# Patient Record
Sex: Male | Born: 1970 | Race: Black or African American | Hispanic: No | Marital: Single | Smoking: Current some day smoker
Health system: Southern US, Community
[De-identification: ages and names within clinical notes are randomized; demographics above are authoritative.]

## PROBLEM LIST (undated history)

## (undated) DIAGNOSIS — B019 Varicella without complication: Secondary | ICD-10-CM

## (undated) HISTORY — DX: Varicella without complication: B01.9

## (undated) HISTORY — PX: INGUINAL HERNIA REPAIR: SUR1180

## (undated) HISTORY — PX: HERNIA REPAIR: SHX51

---

## 2002-09-06 ENCOUNTER — Encounter: Payer: Self-pay | Admitting: Emergency Medicine

## 2002-09-06 ENCOUNTER — Emergency Department (HOSPITAL_COMMUNITY): Admission: EM | Admit: 2002-09-06 | Discharge: 2002-09-06 | Payer: Self-pay | Admitting: Emergency Medicine

## 2006-05-12 ENCOUNTER — Emergency Department (HOSPITAL_COMMUNITY): Admission: EM | Admit: 2006-05-12 | Discharge: 2006-05-12 | Payer: Self-pay | Admitting: Emergency Medicine

## 2008-07-21 IMAGING — CR DG ANKLE COMPLETE 3+V*R*
3 series · 3 of 3 positions shown · non-contrast
Comparison: None.

CLINICAL DATA: Fall with lateral pain and swelling. 
 RIGHT ANKLE ? 3 VIEW:

[t ankle joint ap right]
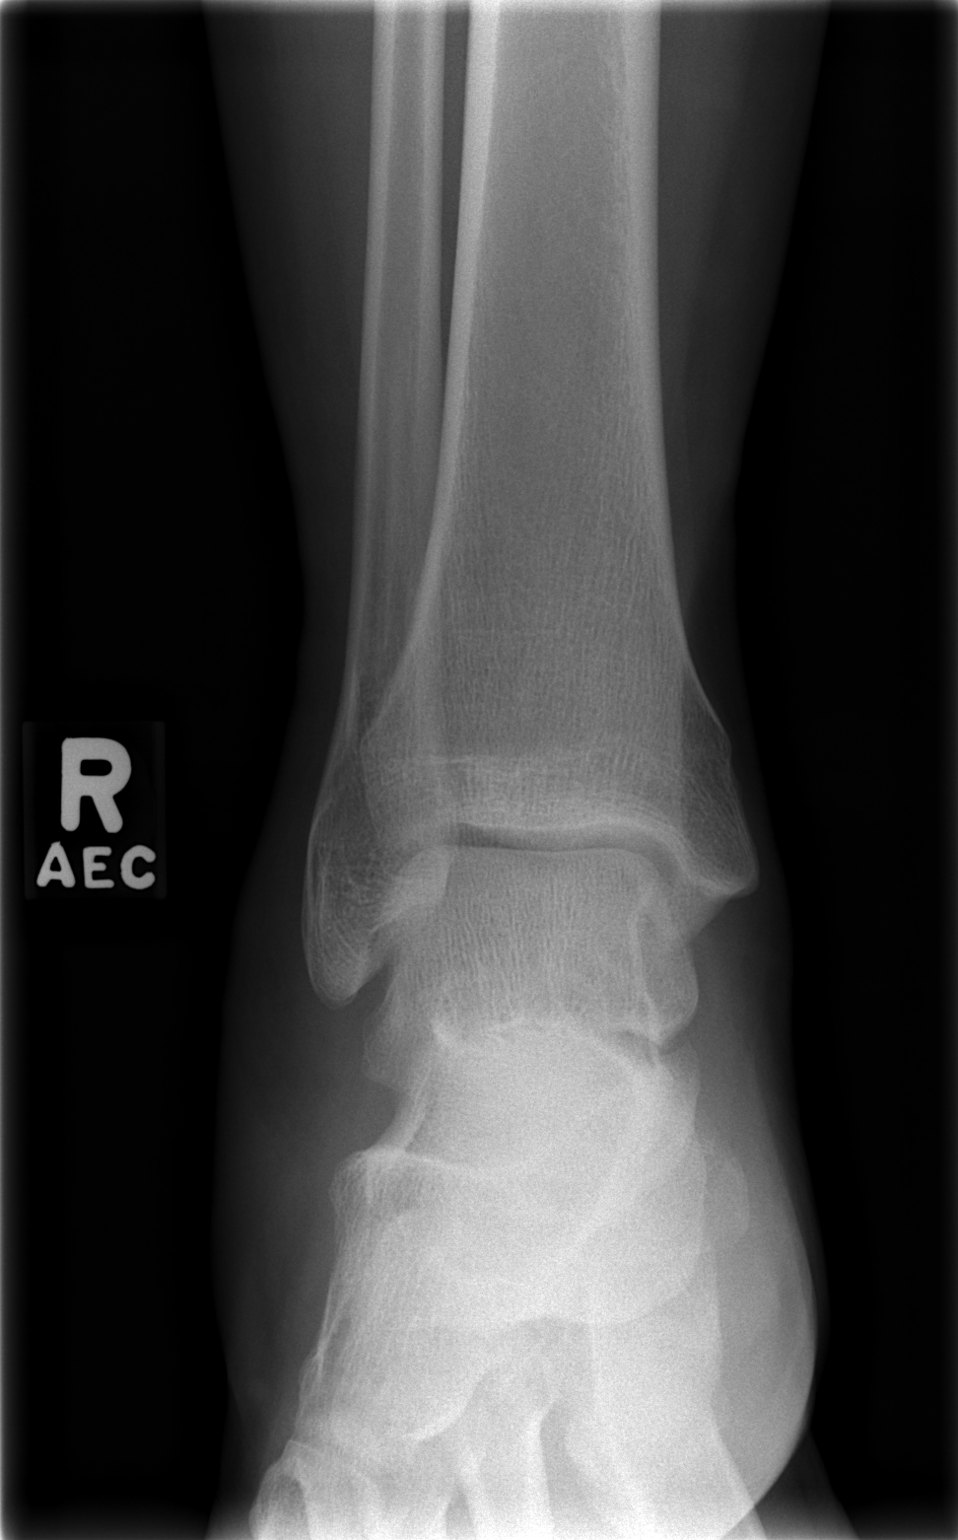

[t ankle joint oblique right]
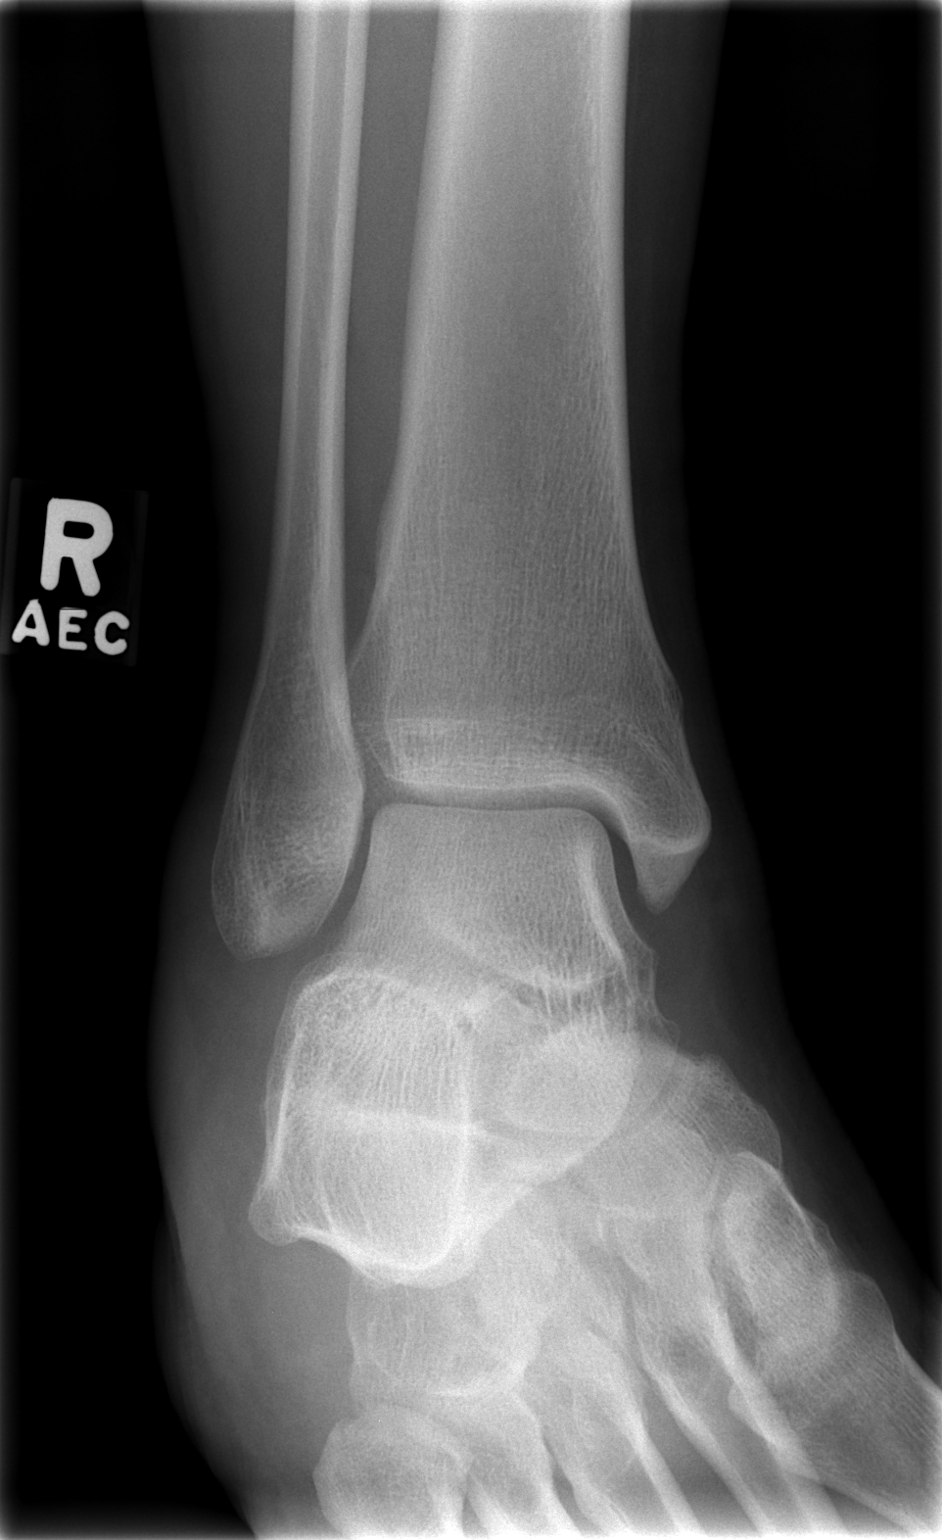

[t ankle joint lat right]
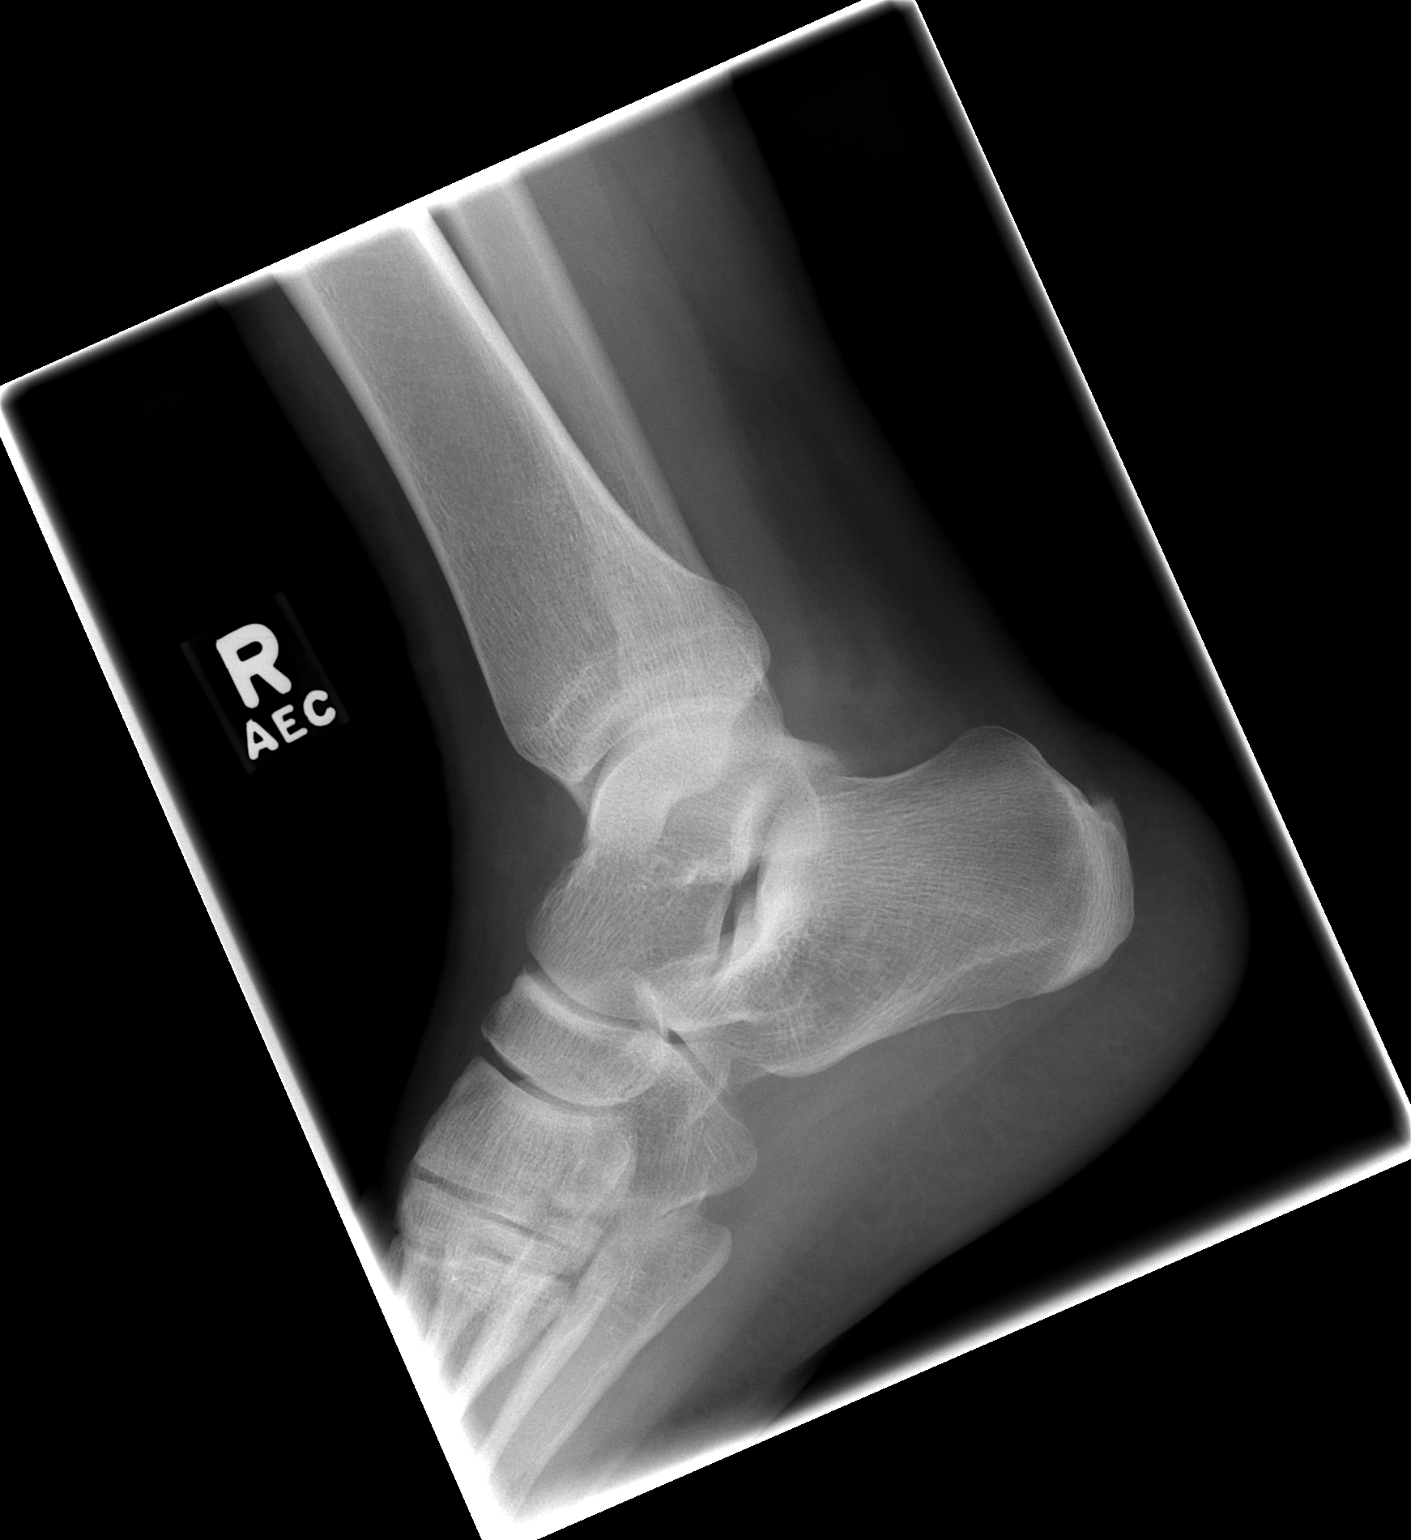

[3 of 3 positions shown; findings below may reference images not displayed]

FINDINGS: Moderate lateral malleolar soft tissue swelling.  Physeal spur about the medial tibia.  Talar dome intact.  Base of 5th metatarsal normal.  Small Achilles spur.  No evidence of fracture.
IMPRESSION: Lateral malleolar soft tissue swelling without acute finding.

## 2010-03-07 ENCOUNTER — Emergency Department (HOSPITAL_BASED_OUTPATIENT_CLINIC_OR_DEPARTMENT_OTHER)
Admission: EM | Admit: 2010-03-07 | Discharge: 2010-03-07 | Payer: Self-pay | Source: Home / Self Care | Admitting: Emergency Medicine

## 2017-02-13 ENCOUNTER — Ambulatory Visit: Payer: BLUE CROSS/BLUE SHIELD | Admitting: Nurse Practitioner

## 2017-02-13 ENCOUNTER — Encounter: Payer: Self-pay | Admitting: Nurse Practitioner

## 2017-02-13 VITALS — BP 116/86 | HR 85 | Temp 98.0°F | Ht 69.5 in | Wt 227.0 lb

## 2017-02-13 DIAGNOSIS — B070 Plantar wart: Secondary | ICD-10-CM | POA: Diagnosis not present

## 2017-02-13 DIAGNOSIS — M79605 Pain in left leg: Secondary | ICD-10-CM

## 2017-02-13 NOTE — Patient Instructions (Addendum)
You will be contacted to schedule appt for venous doppler.  Use compression stocking during the day and off at night.  Use salicylic acid OTC on wart. As directed on package.   Warts Warts are small growths on the skin. They are common, and they are caused by a type of germ (virus). Warts can occur on many areas of the body. A person may have one wart or more than one wart. Warts can spread if you scratch a wart and then scratch normal skin. Most warts will go away over many months to a couple years. Treatments may be done if needed. Follow these instructions at home:  Apply over-the-counter and prescription medicines only as told by your doctor.  Do not apply over-the-counter wart medicines to your face or genitals before you ask your doctor if it is okay to do that.  Do not scratch or pick at a wart.  Wash your hands after you touch a wart.  Avoid shaving hair that is over a wart.  Keep all follow-up visits as told by your doctor. This is important. Contact a doctor if:  Your warts do not improve after treatment.  You have redness, swelling, or pain at the site of a wart.  You have bleeding from a wart, and the bleeding does not stop when you put light pressure on the wart.  You have diabetes and you get a wart. This information is not intended to replace advice given to you by your health care provider. Make sure you discuss any questions you have with your health care provider. Document Released: 06/29/2010 Document Revised: 08/04/2015 Document Reviewed: 05/24/2014 Elsevier Interactive Patient Education  [employer].

## 2017-02-13 NOTE — Progress Notes (Signed)
Subjective:  Patient ID: Brian Ray, male    DOB: [birth year]  Age: 46 y.o. MRN: [id number]  CC: Leg Pain (left leg pain--2 wk ago--felt like pop ,felt like a knot near caft area) and Pain (spot on left foot,painful--3 mo)  Brian Ray is here to establish care. Has no previous pcp. He will like to discuss left calf pain and foot nodule  Leg Pain   The incident occurred more than 1 week ago. There was no injury mechanism. The pain is present in the left leg (left calf muscle). The pain has been fluctuating since onset. Pertinent negatives include no inability to bear weight, muscle weakness, numbness or tingling. The symptoms are aggravated by weight bearing and palpation. He has tried rest for the symptoms. The treatment provided significant relief.  Rash  This is a new (nodule on bottom of left foot.) problem. The current episode started more than 1 month ago. The problem is unchanged. The affected locations include the left foot (no joint pain, no foot swelling, no injury). The rash is characterized by pain. He was exposed to nothing. Pertinent negatives include no fatigue, fever or joint pain. Treatments tried: use on shoe inserts. The treatment provided significant relief.   Hx of plantar's wart on palm of hand..   No outpatient medications prior to visit.   No facility-administered medications prior to visit.     ROS Review of Systems  Constitutional: Negative for fatigue and fever.  Respiratory: Negative.   Cardiovascular: Negative.   Musculoskeletal: Negative for back pain, falls and joint pain.  Skin: Positive for rash.  Neurological: Negative for tingling and numbness.  Endo/Heme/Allergies: Negative.     Objective:  BP 116/86   Pulse 85   Temp 98 F (36.7 C)   Ht 5' 9.5" (1.765 m)   Wt 227 lb (103 kg)   SpO2 98%   BMI 33.04 kg/m   BP Readings from Last 3 Encounters:  02/13/17 116/86    Wt Readings from Last 3 Encounters:  02/13/17 227 lb (103 kg)     Physical Exam  Constitutional: He is oriented to person, place, and time. No distress.  Cardiovascular: Normal rate.  Pulmonary/Chest: Effort normal.  Musculoskeletal: He exhibits edema and tenderness. He exhibits no deformity.       Left knee: Normal.       Left ankle: Normal.       Left lower leg: He exhibits tenderness and swelling.       Feet:  Left calf tenderness. No erythema  Neurological: He is alert and oriented to person, place, and time.  Skin: Skin is warm and dry. Rash noted. No erythema.  Psychiatric: He has a normal mood and affect.  Vitals reviewed.   No results found for: WBC, HGB, HCT, PLT, GLUCOSE, CHOL, TRIG, HDL, LDLDIRECT, LDLCALC, ALT, AST, NA, K, CL, CREATININE, BUN, CO2, TSH, PSA, INR, GLUF, HGBA1C, MICROALBUR  Assessment & Plan:   Brian Ray was seen today for leg pain and pain.  Diagnoses and all orders for this visit:  Leg pain, posterior, left -     VAS [place] LOWER EXTREMITY VENOUS (DVT); Future  Plantar wart -     Salicylic Acid 2 % PADS; Apply 1 application topically daily at 12 noon.   I am having Brian Ray start on Salicylic Acid.  Meds ordered this encounter  Medications  . Salicylic Acid 2 % PADS    Sig: Apply 1 application topically daily at 12 noon.  Refill:  0    Order Specific Question:   Supervising Provider    Answer:   Lucille Passy [[id number]]    Follow-up: Return in about 4 weeks (around 03/13/2017) for CPE (fasting).  Brian Lacy, NP

## 2017-02-14 ENCOUNTER — Inpatient Hospital Stay (HOSPITAL_COMMUNITY): Admission: RE | Admit: 2017-02-14 | Payer: BLUE CROSS/BLUE SHIELD | Source: Ambulatory Visit

## 2017-02-14 ENCOUNTER — Encounter: Payer: Self-pay | Admitting: Nurse Practitioner

## 2017-02-14 MED ORDER — SALICYLIC ACID 2 % EX PADS: 1.0000 "application " | MEDICATED_PAD | Freq: Every day | CUTANEOUS | 0 refills | Status: DC

## 2017-02-19 ENCOUNTER — Ambulatory Visit: Payer: BLUE CROSS/BLUE SHIELD | Admitting: Nurse Practitioner

## 2017-02-22 ENCOUNTER — Ambulatory Visit (HOSPITAL_COMMUNITY)
Admission: RE | Admit: 2017-02-22 | Discharge: 2017-02-22 | Disposition: A | Discharge status: Home / Self Care | Payer: BLUE CROSS/BLUE SHIELD | Source: Ambulatory Visit | Attending: Nurse Practitioner | Admitting: Nurse Practitioner

## 2017-02-22 ENCOUNTER — Encounter: Payer: Self-pay | Admitting: Nurse Practitioner

## 2017-02-22 ENCOUNTER — Ambulatory Visit: Payer: BLUE CROSS/BLUE SHIELD | Admitting: Nurse Practitioner

## 2017-02-22 VITALS — BP 120/80 | HR 98 | Temp 98.0°F | Ht 69.5 in | Wt 232.0 lb

## 2017-02-22 DIAGNOSIS — Z8042 Family history of malignant neoplasm of prostate: Secondary | ICD-10-CM

## 2017-02-22 DIAGNOSIS — Z1211 Encounter for screening for malignant neoplasm of colon: Secondary | ICD-10-CM

## 2017-02-22 DIAGNOSIS — Z13220 Encounter for screening for lipoid disorders: Secondary | ICD-10-CM

## 2017-02-22 DIAGNOSIS — Z125 Encounter for screening for malignant neoplasm of prostate: Secondary | ICD-10-CM | POA: Diagnosis not present

## 2017-02-22 DIAGNOSIS — Z136 Encounter for screening for cardiovascular disorders: Secondary | ICD-10-CM | POA: Diagnosis not present

## 2017-02-22 DIAGNOSIS — Z114 Encounter for screening for human immunodeficiency virus [HIV]: Secondary | ICD-10-CM

## 2017-02-22 DIAGNOSIS — Z800 Family history of malignant neoplasm of digestive organs: Secondary | ICD-10-CM | POA: Insufficient documentation

## 2017-02-22 DIAGNOSIS — M79605 Pain in left leg: Secondary | ICD-10-CM | POA: Insufficient documentation

## 2017-02-22 DIAGNOSIS — Z0000 Encounter for general adult medical examination without abnormal findings: Secondary | ICD-10-CM | POA: Diagnosis not present

## 2017-02-22 NOTE — Progress Notes (Signed)
*  PRELIMINARY RESULTS* Vascular Ultrasound Left lower extremity venous duplex has been completed.  Preliminary findings: No evidence of deep vein thrombosis or baker's cysts in the left lower extremity.  Small hypoechoic fluid collection noted in the medial aspect of the left calf.  Preliminary results called to [facility] @ 11:31.   Myrtie Cruise Rosalene Wardrop 02/22/2017, 11:30 AM

## 2017-02-22 NOTE — Progress Notes (Signed)
Subjective:    Patient ID: Brian Ray, male    DOB: [birth year], 46 y.o.   MRN: [id number]  Patient presents today for complete physical and re eval of right calf swelling.  HPI  Improved right calf swelling and pain with use of compression stocking. Venous doppler was negative for DVT.  No previous pcp.  Immunizations: (TDAP, Hep C screen, Pneumovax, Influenza, zoster)  Health Maintenance  Topic Date Due  . Flu Shot  10/16/2017*  . Tetanus Vaccine  01/11/2023  . HIV Screening  Completed  *Topic was postponed. The date shown is not the original due date.   Diet:regular.  Weight:  Wt Readings from Last 3 Encounters:  02/22/17 232 lb (105.2 kg)  02/13/17 227 lb (103 kg)   Exercise: none.  Fall Risk: Fall Risk  02/22/2017  Falls in the past year? No   Home Safety:home with girlfriend.  Depression/Suicide: Depression screen [facility] 2/9 02/22/2017  Decreased Interest 0  Down, Depressed, Hopeless 0  PHQ - 2 Score 0   Vision:will schedule.  Dental:will schedule.  Sexual History (birth control, marital status, STD):single, sexually active  Medications and allergies reviewed with patient and updated if appropriate.  Patient Active Problem List   Diagnosis Date Noted  . FHx: cancer of prostate 02/22/2017    Current Outpatient Medications on File Prior to Visit  Medication Sig Dispense Refill  . Salicylic Acid 2 % PADS Apply 1 application topically daily at 12 noon.  0   No current facility-administered medications on file prior to visit.     Past Medical History:  Diagnosis Date  . Chicken pox     Past Surgical History:  Procedure Laterality Date  . HERNIA REPAIR Right    inguinal    Social History   Socioeconomic History  . Marital status: Single    Spouse name: None  . Number of children: None  . Years of education: None  . Highest education level: None  Social Needs  . Financial resource strain: None  . Food insecurity - worry: None  . Food  insecurity - inability: None  . Transportation needs - medical: None  . Transportation needs - non-medical: None  Occupational History  . None  Tobacco Use  . Smoking status: Never Smoker  . Smokeless tobacco: Never Used  Substance and Sexual Activity  . Alcohol use: Yes    Comment: social  . Drug use: No  . Sexual activity: Yes    Birth control/protection: None  Other Topics Concern  . None  Social History Narrative  . None    Family History  Problem Relation Age of Onset  . ALS Mother 65  . Cancer Brother 44       prostate cancer  . Alcohol abuse Father   . Cancer Maternal Uncle        unknown type.       Review of Systems  Constitutional: Negative for fever, malaise/fatigue and weight loss.  HENT: Negative for congestion and sore throat.   Eyes:       Negative for visual changes  Respiratory: Negative for cough and shortness of breath.   Cardiovascular: Negative for chest pain and palpitations.  Gastrointestinal: Negative for blood in stool, constipation, diarrhea and heartburn.  Genitourinary: Negative for dysuria, frequency and urgency.  Musculoskeletal: Negative for falls, joint pain and myalgias.  Skin: Negative for rash.  Neurological: Negative for dizziness, sensory change and headaches.  Endo/Heme/Allergies: Does not bruise/bleed easily.  Psychiatric/Behavioral: Negative for depression,  substance abuse and suicidal ideas. The patient is not nervous/anxious and does not have insomnia.     Objective:   Vitals:   02/22/17 1512  BP: 120/80  Pulse: 98  Temp: 98 F (36.7 C)  SpO2: 97%    Body mass index is 33.77 kg/m.   Physical Examination:  Physical Exam  Constitutional: He is oriented to person, place, and time and well-developed, well-nourished, and in no distress. No distress.  HENT:  Right Ear: External ear normal.  Left Ear: External ear normal.  Nose: Nose normal.  Mouth/Throat: Oropharynx is clear and moist. No oropharyngeal exudate.    Eyes: Conjunctivae and EOM are normal. Pupils are equal, round, and reactive to light. No scleral icterus.  Neck: Normal range of motion. Neck supple. No thyromegaly present.  Cardiovascular: Normal rate, regular rhythm, normal heart sounds and intact distal pulses.  Pulmonary/Chest: Effort normal and breath sounds normal.  Abdominal: Bowel sounds are normal. He exhibits no distension. There is no tenderness.  Genitourinary: Prostate normal. Rectal exam shows no external hemorrhoid, no internal hemorrhoid, no fissure and guaiac negative stool.  Musculoskeletal: Normal range of motion. He exhibits no edema, tenderness or deformity.       Right hip: Normal.       Right knee: Normal.       Right ankle: Normal.       Right upper leg: Normal.       Right lower leg: He exhibits no tenderness, no bony tenderness, no edema, no deformity and no laceration.       Right foot: Normal.  Improved right calf swelling with resolved tenderness. No erythema. No induration  Lymphadenopathy:    He has no cervical adenopathy.  Neurological: He is alert and oriented to person, place, and time. He has normal reflexes. No cranial nerve deficit. Gait normal.  Skin: Skin is warm and dry.  Psychiatric: Affect and judgment normal.  Vitals reviewed.   ASSESSMENT and PLAN:  Brian Ray was seen today for establish care.  Diagnoses and all orders for this visit:  Preventative health care -     Cancel: Comprehensive metabolic panel -     Cancel: Lipid panel -     Cancel: CBC with Differential/Platelet -     Cancel: TSH -     Cancel: PSA -     Cancel: HIV antibody -     Lipid panel; Future -     Comprehensive metabolic panel; Future -     CBC with Differential/Platelet; Future -     TSH; Future -     HIV antibody; Future -     PSA; Future -     PSA -     HIV antibody -     TSH -     CBC with Differential/Platelet -     Comprehensive metabolic panel -     Lipid panel  Encounter for lipid screening for  cardiovascular disease -     Cancel: Lipid panel -     Lipid panel; Future -     Lipid panel  Encounter for prostate cancer screening -     Cancel: PSA -     PSA; Future -     PSA  Encounter for screening for HIV -     Cancel: HIV antibody -     HIV antibody; Future -     HIV antibody  Colon cancer screening -     IFOBT POC (occult bld, rslt in office); Future  FHx: cancer of prostate -     PSA; Future -     PSA   No problem-specific Assessment & Plan notes found for this encounter.    Recent Results (from the past 2160 hour(s))  PSA     Status: None   Collection Time: 02/22/17  4:22 PM  Result Value Ref Range   PSA 0.8 < OR = 4.0 ng/mL    Comment: The total PSA value from this assay system is  standardized against the WHO standard. The test  result will be approximately 20% lower when compared  to the equimolar-standardized total PSA (Beckman  Coulter). Comparison of serial PSA results should be  interpreted with this fact in mind. . This test was performed using the Siemens  chemiluminescent method. Values obtained from  different assay methods cannot be used interchangeably. PSA levels, regardless of value, should not be interpreted as absolute evidence of the presence or absence of disease.   HIV antibody     Status: None   Collection Time: 02/22/17  4:22 PM  Result Value Ref Range   HIV 1&2 Ab, 4th Generation NON-REACTIVE NON-REACTI    Comment: HIV-1 antigen and HIV-1/HIV-2 antibodies were not detected. There is no laboratory evidence of HIV infection. Marland Kitchen PLEASE NOTE: This information has been disclosed to you from records whose confidentiality may be protected by state law.  If your state requires such protection, then the state law prohibits you from making any further disclosure of the information without the specific written consent of the person to whom it pertains, or as otherwise permitted by law. A general authorization for the release of  medical or other information is NOT sufficient for this purpose. . For additional information please refer to http://education.[facility].com/faq/FAQ106 (This link is being provided for informational/ educational purposes only.) . Marland Kitchen The performance of this assay has not been clinically validated in patients less than 45 years old. .   TSH     Status: None   Collection Time: 02/22/17  4:22 PM  Result Value Ref Range   TSH 0.45 0.40 - 4.50 mIU/L  CBC with Differential/Platelet     Status: None   Collection Time: 02/22/17  4:22 PM  Result Value Ref Range   WBC 8.8 3.8 - 10.8 Thousand/uL   RBC 4.99 4.20 - 5.80 Million/uL   Hemoglobin 14.1 13.2 - 17.1 g/dL   HCT 42.1 38.5 - 50.0 %   MCV 84.4 80.0 - 100.0 fL   MCH 28.3 27.0 - 33.0 pg   MCHC 33.5 32.0 - 36.0 g/dL   RDW 12.3 11.0 - 15.0 %   Platelets 397 140 - 400 Thousand/uL   MPV 10.1 7.5 - 12.5 fL   Neutro Abs 6,019 1,500 - 7,800 cells/uL   Lymphs Abs 1,892 850 - 3,900 cells/uL   WBC mixed population 546 200 - 950 cells/uL   Eosinophils Absolute 299 15 - 500 cells/uL   Basophils Absolute 44 0 - 200 cells/uL   Neutrophils Relative % 68.4 %   Total Lymphocyte 21.5 %   Monocytes Relative 6.2 %   Eosinophils Relative 3.4 %   Basophils Relative 0.5 %  Comprehensive metabolic panel     Status: None   Collection Time: 02/22/17  4:22 PM  Result Value Ref Range   Glucose, Bld 99 65 - 99 mg/dL    Comment: .            Fasting reference interval .    BUN 11 7 - 25  mg/dL   Creat 0.86 0.60 - 1.35 mg/dL   BUN/Creatinine Ratio NOT APPLICABLE 6 - 22 (calc)   Sodium 141 135 - 146 mmol/L   Potassium 4.3 3.5 - 5.3 mmol/L   Chloride 106 98 - 110 mmol/L   CO2 27 20 - 32 mmol/L   Calcium 9.4 8.6 - 10.3 mg/dL   Total Protein 7.4 6.1 - 8.1 g/dL   Albumin 3.9 3.6 - 5.1 g/dL   Globulin 3.5 1.9 - 3.7 g/dL (calc)   AG Ratio 1.1 1.0 - 2.5 (calc)   Total Bilirubin 0.4 0.2 - 1.2 mg/dL   Alkaline phosphatase (APISO) 57 40 - 115 U/L    AST 13 10 - 40 U/L   ALT 15 9 - 46 U/L  Lipid panel     Status: Abnormal   Collection Time: 02/22/17  4:22 PM  Result Value Ref Range   Cholesterol 132 <200 mg/dL   HDL 66 >40 mg/dL   Triglycerides 152 (H) <150 mg/dL   LDL Cholesterol (Calc) 43 mg/dL (calc)    Comment: Reference range: <100 . Desirable range <100 mg/dL for primary prevention;   <70 mg/dL for patients with CHD or diabetic patients  with > or = 2 CHD risk factors. Marland Kitchen LDL-C is now calculated using the Martin-Hopkins  calculation, which is a validated novel method providing  better accuracy than the Friedewald equation in the  estimation of LDL-C.  Cresenciano Genre et al. Annamaria Helling. 3810;175(10): 2061-2068  (http://education.[facility].com/faq/FAQ164)    Total CHOL/HDL Ratio 2.0 <5.0 (calc)   Non-HDL Cholesterol (Calc) 66 <130 mg/dL (calc)    Comment: For patients with diabetes plus 1 major ASCVD risk  factor, treating to a non-HDL-C goal of <100 mg/dL  (LDL-C of <70 mg/dL) is considered a therapeutic  option.    Follow up: Return if symptoms worsen or fail to improve.  Wilfred Lacy, NP

## 2017-02-22 NOTE — Patient Instructions (Addendum)
Normal labs results.  Continue use of compression stocking as previously discussed.   Health Maintenance, Male A healthy lifestyle and preventive care is important for your health and wellness. Ask your health care provider about what schedule of regular examinations is right for you. What should I know about weight and diet? Eat a Healthy Diet  Eat plenty of vegetables, fruits, whole grains, low-fat dairy products, and lean protein.  Do not eat a lot of foods high in solid fats, added sugars, or salt.  Maintain a Healthy Weight Regular exercise can help you achieve or maintain a healthy weight. You should:  Do at least 150 minutes of exercise each week. The exercise should increase your heart rate and make you sweat (moderate-intensity exercise).  Do strength-training exercises at least twice a week.  Watch Your Levels of Cholesterol and Blood Lipids  Have your blood tested for lipids and cholesterol every 5 years starting at 44 years of age. If you are at high risk for heart disease, you should start having your blood tested when you are 44 years old. You may need to have your cholesterol levels checked more often if: ? Your lipid or cholesterol levels are high. ? You are older than 44 years of age. ? You are at high risk for heart disease.  What should I know about cancer screening? Many types of cancers can be detected early and may often be prevented. Lung Cancer  You should be screened every year for lung cancer if: ? You are a current smoker who has smoked for at least 30 years. ? You are a former smoker who has quit within the past 15 years.  Talk to your health care provider about your screening options, when you should start screening, and how often you should be screened.  Colorectal Cancer  Routine colorectal cancer screening usually begins at 44 years of age and should be repeated every 5-10 years until you are 44 years old. You may need to be screened more often  if early forms of precancerous polyps or small growths are found. Your health care provider may recommend screening at an earlier age if you have risk factors for colon cancer.  Your health care provider may recommend using home test kits to check for hidden blood in the stool.  A small camera at the end of a tube can be used to examine your colon (sigmoidoscopy or colonoscopy). This checks for the earliest forms of colorectal cancer.  Prostate and Testicular Cancer  Depending on your age and overall health, your health care provider may do certain tests to screen for prostate and testicular cancer.  Talk to your health care provider about any symptoms or concerns you have about testicular or prostate cancer.  Skin Cancer  Check your skin from head to toe regularly.  Tell your health care provider about any new moles or changes in moles, especially if: ? There is a change in a mole's size, shape, or color. ? You have a mole that is larger than a pencil eraser.  Always use sunscreen. Apply sunscreen liberally and repeat throughout the day.  Protect yourself by wearing long sleeves, pants, a wide-brimmed hat, and sunglasses when outside.  What should I know about heart disease, diabetes, and high blood pressure?  If you are 49-75 years of age, have your blood pressure checked every 3-5 years. If you are 37 years of age or older, have your blood pressure checked every year. You should have your blood  pressure measured twice-once when you are at a hospital or clinic, and once when you are not at a hospital or clinic. Record the average of the two measurements. To check your blood pressure when you are not at a hospital or clinic, you can use: ? An automated blood pressure machine at a pharmacy. ? A home blood pressure monitor.  Talk to your health care provider about your target blood pressure.  If you are between 47-89 years old, ask your health care provider if you should take aspirin  to prevent heart disease.  Have regular diabetes screenings by checking your fasting blood sugar level. ? If you are at a normal weight and have a low risk for diabetes, have this test once every three years after the age of 58. ? If you are overweight and have a high risk for diabetes, consider being tested at a younger age or more often.  A one-time screening for abdominal aortic aneurysm (AAA) by ultrasound is recommended for men aged 74-75 years who are current or former smokers. What should I know about preventing infection? Hepatitis B If you have a higher risk for hepatitis B, you should be screened for this virus. Talk with your health care provider to find out if you are at risk for hepatitis B infection. Hepatitis C Blood testing is recommended for:  Everyone born from [birth year] through 1963.  Anyone with known risk factors for hepatitis C.  Sexually Transmitted Diseases (STDs)  You should be screened each year for STDs including gonorrhea and chlamydia if: ? You are sexually active and are younger than 44 years of age. ? You are older than 44 years of age and your health care provider tells you that you are at risk for this type of infection. ? Your sexual activity has changed since you were last screened and you are at an increased risk for chlamydia or gonorrhea. Ask your health care provider if you are at risk.  Talk with your health care provider about whether you are at high risk of being infected with HIV. Your health care provider may recommend a prescription medicine to help prevent HIV infection.  What else can I do?  Schedule regular health, dental, and eye exams.  Stay current with your vaccines (immunizations).  Do not use any tobacco products, such as cigarettes, chewing tobacco, and e-cigarettes. If you need help quitting, ask your health care provider.  Limit alcohol intake to no more than 2 drinks per day. One drink equals 12 ounces of beer, 5 ounces of wine,  or 1 ounces of hard liquor.  Do not use street drugs.  Do not share needles.  Ask your health care provider for help if you need support or information about quitting drugs.  Tell your health care provider if you often feel depressed.  Tell your health care provider if you have ever been abused or do not feel safe at home. This information is not intended to replace advice given to you by your health care provider. Make sure you discuss any questions you have with your health care provider. Document Released: 08/25/2007 Document Revised: 10/26/2015 Document Reviewed: 11/30/2014 Elsevier Interactive Patient Education  [employer].

## 2017-02-23 LAB — CBC WITH DIFFERENTIAL/PLATELET
Basophils Absolute: 44 cells/uL (ref 0–200)
Basophils Relative: 0.5 %
Eosinophils Absolute: 299 cells/uL (ref 15–500)
Eosinophils Relative: 3.4 %
HCT: 42.1 % (ref 38.5–50.0)
Hemoglobin: 14.1 g/dL (ref 13.2–17.1)
Lymphs Abs: 1892 cells/uL (ref 850–3900)
MCH: 28.3 pg (ref 27.0–33.0)
MCHC: 33.5 g/dL (ref 32.0–36.0)
MCV: 84.4 fL (ref 80.0–100.0)
MPV: 10.1 fL (ref 7.5–12.5)
Monocytes Relative: 6.2 %
Neutro Abs: 6019 cells/uL (ref 1500–7800)
Neutrophils Relative %: 68.4 %
Platelets: 397 10*3/uL (ref 140–400)
RBC: 4.99 10*6/uL (ref 4.20–5.80)
RDW: 12.3 % (ref 11.0–15.0)
Total Lymphocyte: 21.5 %
WBC mixed population: 546 cells/uL (ref 200–950)
WBC: 8.8 10*3/uL (ref 3.8–10.8)

## 2017-02-23 LAB — PSA: PSA: 0.8 ng/mL (ref <=4.0)

## 2017-02-23 LAB — COMPREHENSIVE METABOLIC PANEL
AG Ratio: 1.1 (calc) (ref 1.0–2.5)
ALT: 15 U/L (ref 9–46)
AST: 13 U/L (ref 10–40)
Albumin: 3.9 g/dL (ref 3.6–5.1)
Alkaline phosphatase (APISO): 57 U/L (ref 40–115)
BUN: 11 mg/dL (ref 7–25)
CO2: 27 mmol/L (ref 20–32)
Calcium: 9.4 mg/dL (ref 8.6–10.3)
Chloride: 106 mmol/L (ref 98–110)
Creat: 0.86 mg/dL (ref 0.60–1.35)
Globulin: 3.5 g/dL (calc) (ref 1.9–3.7)
Glucose, Bld: 99 mg/dL (ref 65–99)
Potassium: 4.3 mmol/L (ref 3.5–5.3)
Sodium: 141 mmol/L (ref 135–146)
Total Bilirubin: 0.4 mg/dL (ref 0.2–1.2)
Total Protein: 7.4 g/dL (ref 6.1–8.1)

## 2017-02-23 LAB — HIV ANTIBODY (ROUTINE TESTING W REFLEX): HIV 1&2 Ab, 4th Generation: NONREACTIVE

## 2017-02-23 LAB — LIPID PANEL
Cholesterol: 132 mg/dL (ref <200)
HDL: 66 mg/dL (ref >40)
LDL Cholesterol (Calc): 43 mg/dL (calc)
Non-HDL Cholesterol (Calc): 66 mg/dL (calc) (ref <130)
Total CHOL/HDL Ratio: 2 (calc) (ref <5.0)
Triglycerides: 152 mg/dL — ABNORMAL HIGH (ref <150)

## 2017-02-23 LAB — TSH: TSH: 0.45 mIU/L (ref 0.40–4.50)

## 2017-02-25 ENCOUNTER — Encounter (HOSPITAL_COMMUNITY): Payer: BLUE CROSS/BLUE SHIELD

## 2017-02-25 ENCOUNTER — Encounter: Payer: Self-pay | Admitting: Nurse Practitioner

## 2017-05-03 ENCOUNTER — Encounter: Payer: Self-pay | Admitting: Nurse Practitioner

## 2017-05-03 ENCOUNTER — Ambulatory Visit: Payer: BLUE CROSS/BLUE SHIELD | Admitting: Nurse Practitioner

## 2017-05-03 VITALS — BP 118/78 | HR 96 | Temp 98.1°F | Ht 69.5 in | Wt 243.0 lb

## 2017-05-03 DIAGNOSIS — J101 Influenza due to other identified influenza virus with other respiratory manifestations: Secondary | ICD-10-CM

## 2017-05-03 DIAGNOSIS — J9801 Acute bronchospasm: Secondary | ICD-10-CM

## 2017-05-03 DIAGNOSIS — D229 Melanocytic nevi, unspecified: Secondary | ICD-10-CM | POA: Diagnosis not present

## 2017-05-03 LAB — POCT INFLUENZA A/B
Influenza A, POC: POSITIVE — AB
Influenza B, POC: NEGATIVE

## 2017-05-03 MED ORDER — GUAIFENESIN ER 600 MG PO TB12: 600.0000 mg | ORAL_TABLET | Freq: Two times a day (BID) | ORAL | 0 refills | Status: DC | PRN

## 2017-05-03 MED ORDER — ALBUTEROL SULFATE HFA 108 (90 BASE) MCG/ACT IN AERS: 1.0000 | INHALATION_SPRAY | Freq: Four times a day (QID) | RESPIRATORY_TRACT | 0 refills | Status: DC | PRN

## 2017-05-03 MED ORDER — CHLORPHEN-PE-ACETAMINOPHEN 4-10-325 MG PO TABS: 1.0000 | ORAL_TABLET | Freq: Two times a day (BID) | ORAL | 0 refills | Status: DC

## 2017-05-03 MED ORDER — PROMETHAZINE-DM 6.25-15 MG/5ML PO SYRP: 5.0000 mL | ORAL_SOLUTION | Freq: Three times a day (TID) | ORAL | 0 refills | Status: DC | PRN

## 2017-05-03 MED ORDER — OSELTAMIVIR PHOSPHATE 75 MG PO CAPS: 75.0000 mg | ORAL_CAPSULE | Freq: Two times a day (BID) | ORAL | 0 refills | Status: DC

## 2017-05-03 NOTE — Patient Instructions (Signed)

## 2017-05-03 NOTE — Progress Notes (Signed)
Subjective:  Patient ID: Brian Ray, male    DOB: [birth year]  Age: 47 y.o. MRN: [id number]  CC: Nasal Congestion (congestion yellow mucus,bodyache,headache,cant sleep,fever last night/ 1 day. )   URI   This is a new problem. The current episode started yesterday. The problem has been gradually worsening. The maximum temperature recorded prior to his arrival was 100.4 - 100.9 F. The fever has been present for less than 1 day. Associated symptoms include congestion, coughing, headaches, nausea, a plugged ear sensation, rhinorrhea, sinus pain, sneezing, a sore throat, swollen glands, vomiting and wheezing. Pertinent negatives include no abdominal pain, chest pain, diarrhea or rash. He has tried acetaminophen and decongestant for the symptoms. The treatment provided mild relief.    Outpatient Medications Prior to Visit  Medication Sig Dispense Refill  . Salicylic Acid 2 % PADS Apply 1 application topically daily at 12 noon. (Patient not taking: Reported on 05/03/2017)  0   No facility-administered medications prior to visit.     ROS See HPI  Objective:  BP 118/78   Pulse 96   Temp 98.1 F (36.7 C)   Ht 5' 9.5" (1.765 m)   Wt 243 lb (110.2 kg)   SpO2 97%   BMI 35.37 kg/m   BP Readings from Last 3 Encounters:  05/03/17 118/78  02/22/17 120/80  02/13/17 116/86    Wt Readings from Last 3 Encounters:  05/03/17 243 lb (110.2 kg)  02/22/17 232 lb (105.2 kg)  02/13/17 227 lb (103 kg)    Physical Exam  Constitutional: He is oriented to person, place, and time. No distress.  HENT:  Head:    Right Ear: Tympanic membrane, external ear and ear canal normal.  Left Ear: Tympanic membrane and ear canal normal.  Nose: Mucosal edema and rhinorrhea present. Right sinus exhibits maxillary sinus tenderness and frontal sinus tenderness. Left sinus exhibits maxillary sinus tenderness and frontal sinus tenderness.  Mouth/Throat: Uvula is midline. Posterior oropharyngeal erythema present.  No oropharyngeal exudate.  Eyes: No scleral icterus.  Neck: Normal range of motion. Neck supple.  Cardiovascular: Normal rate and regular rhythm.  Pulmonary/Chest: Effort normal. No respiratory distress. He has wheezes. He has no rales.  Musculoskeletal: He exhibits no edema.  Lymphadenopathy:    He has cervical adenopathy.  Neurological: He is alert and oriented to person, place, and time.  Skin: Skin is warm and dry. Lesion noted.  Vitals reviewed.   Lab Results  Component Value Date   WBC 8.8 02/22/2017   HGB 14.1 02/22/2017   HCT 42.1 02/22/2017   PLT 397 02/22/2017   GLUCOSE 99 02/22/2017   CHOL 132 02/22/2017   TRIG 152 (H) 02/22/2017   HDL 66 02/22/2017   ALT 15 02/22/2017   AST 13 02/22/2017   NA 141 02/22/2017   K 4.3 02/22/2017   CL 106 02/22/2017   CREATININE 0.86 02/22/2017   BUN 11 02/22/2017   CO2 27 02/22/2017   TSH 0.45 02/22/2017   PSA 0.8 02/22/2017    Assessment & Plan:   Brian Ray was seen today for nasal congestion.  Diagnoses and all orders for this visit:  Influenza A -     POCT Influenza A/B -     oseltamivir (TAMIFLU) 75 MG capsule; Take 1 capsule (75 mg total) by mouth 2 (two) times daily. -     promethazine-dextromethorphan (PROMETHAZINE-DM) 6.25-15 MG/5ML syrup; Take 5 mLs by mouth 3 (three) times daily as needed for cough. -     guaiFENesin ([place]) 600  MG 12 hr tablet; Take 1 tablet (600 mg total) by mouth 2 (two) times daily as needed for cough or to loosen phlegm. -     Chlorphen-PE-Acetaminophen 4-10-325 MG TABS; Take 1 tablet by mouth every 12 (twelve) hours. -     albuterol (PROVENTIL HFA;VENTOLIN HFA) 108 (90 Base) MCG/ACT inhaler; Inhale 1-2 puffs into the lungs every 6 (six) hours as needed.  Benign skin mole -     Ambulatory referral to Dermatology  Acute bronchospasm -     albuterol (PROVENTIL HFA;VENTOLIN HFA) 108 (90 Base) MCG/ACT inhaler; Inhale 1-2 puffs into the lungs every 6 (six) hours as needed.   I am having Brian  Ray start on oseltamivir, promethazine-dextromethorphan, guaiFENesin, Chlorphen-PE-Acetaminophen, and albuterol. I am also having him maintain his Salicylic Acid.  Meds ordered this encounter  Medications  . oseltamivir (TAMIFLU) 75 MG capsule    Sig: Take 1 capsule (75 mg total) by mouth 2 (two) times daily.    Dispense:  10 capsule    Refill:  0    Order Specific Question:   Supervising Provider    Answer:   Lucille Passy [[id number]]  . promethazine-dextromethorphan (PROMETHAZINE-DM) 6.25-15 MG/5ML syrup    Sig: Take 5 mLs by mouth 3 (three) times daily as needed for cough.    Dispense:  120 mL    Refill:  0    Order Specific Question:   Supervising Provider    Answer:   Lucille Passy [[id number]]  . guaiFENesin (MUCINEX) 600 MG 12 hr tablet    Sig: Take 1 tablet (600 mg total) by mouth 2 (two) times daily as needed for cough or to loosen phlegm.    Dispense:  14 tablet    Refill:  0    Order Specific Question:   Supervising Provider    Answer:   Lucille Passy [[id number]]  . Chlorphen-PE-Acetaminophen 4-10-325 MG TABS    Sig: Take 1 tablet by mouth every 12 (twelve) hours.    Dispense:  6 tablet    Refill:  0    Order Specific Question:   Supervising Provider    Answer:   Lucille Passy [[id number]]  . albuterol (PROVENTIL HFA;VENTOLIN HFA) 108 (90 Base) MCG/ACT inhaler    Sig: Inhale 1-2 puffs into the lungs every 6 (six) hours as needed.    Dispense:  1 Inhaler    Refill:  0    Order Specific Question:   Supervising Provider    Answer:   Lucille Passy [[id number]]    Follow-up: Return if symptoms worsen or fail to improve.  Brian Lacy, NP

## 2018-06-27 ENCOUNTER — Telehealth: Payer: Self-pay | Admitting: Nurse Practitioner

## 2018-06-27 NOTE — Telephone Encounter (Signed)
Called and left vm. Calling to schedule virtual visit with Baldo Ash.

## 2018-12-03 ENCOUNTER — Ambulatory Visit (INDEPENDENT_AMBULATORY_CARE_PROVIDER_SITE_OTHER): Payer: BC Managed Care – PPO | Admitting: Nurse Practitioner

## 2018-12-03 ENCOUNTER — Other Ambulatory Visit: Payer: Self-pay

## 2018-12-03 ENCOUNTER — Encounter: Payer: Self-pay | Admitting: Nurse Practitioner

## 2018-12-03 VITALS — BP 116/82 | HR 66 | Temp 98.1°F | Ht 69.5 in | Wt 247.4 lb

## 2018-12-03 DIAGNOSIS — Z125 Encounter for screening for malignant neoplasm of prostate: Secondary | ICD-10-CM

## 2018-12-03 DIAGNOSIS — Z0000 Encounter for general adult medical examination without abnormal findings: Secondary | ICD-10-CM | POA: Diagnosis not present

## 2018-12-03 DIAGNOSIS — Z136 Encounter for screening for cardiovascular disorders: Secondary | ICD-10-CM

## 2018-12-03 DIAGNOSIS — Z800 Family history of malignant neoplasm of digestive organs: Secondary | ICD-10-CM

## 2018-12-03 DIAGNOSIS — Z0001 Encounter for general adult medical examination with abnormal findings: Secondary | ICD-10-CM

## 2018-12-03 DIAGNOSIS — Z13220 Encounter for screening for lipoid disorders: Secondary | ICD-10-CM

## 2018-12-03 DIAGNOSIS — R21 Rash and other nonspecific skin eruption: Secondary | ICD-10-CM

## 2018-12-03 LAB — CBC
HCT: 42.5 % (ref 39.0–52.0)
Hemoglobin: 13.7 g/dL (ref 13.0–17.0)
MCHC: 32.2 g/dL (ref 30.0–36.0)
MCV: 88 fl (ref 78.0–100.0)
Platelets: 392 10*3/uL (ref 150.0–400.0)
RBC: 4.83 Mil/uL (ref 4.22–5.81)
RDW: 13.9 % (ref 11.5–15.5)
WBC: 7.2 10*3/uL (ref 4.0–10.5)

## 2018-12-03 LAB — COMPREHENSIVE METABOLIC PANEL
ALT: 16 U/L (ref 0–53)
AST: 14 U/L (ref 0–37)
Albumin: 4 g/dL (ref 3.5–5.2)
Alkaline Phosphatase: 63 U/L (ref 39–117)
BUN: 11 mg/dL (ref 6–23)
CO2: 28 mEq/L (ref 19–32)
Calcium: 9.5 mg/dL (ref 8.4–10.5)
Chloride: 103 mEq/L (ref 96–112)
Creatinine, Ser: 0.85 mg/dL (ref 0.40–1.50)
GFR: 117.27 mL/min (ref >60.00)
Glucose, Bld: 82 mg/dL (ref 70–99)
Potassium: 4.3 mEq/L (ref 3.5–5.1)
Sodium: 140 mEq/L (ref 135–145)
Total Bilirubin: 0.6 mg/dL (ref 0.2–1.2)
Total Protein: 7.4 g/dL (ref 6.0–8.3)

## 2018-12-03 LAB — LIPID PANEL
Cholesterol: 127 mg/dL (ref 0–200)
HDL: 58.2 mg/dL (ref >39.00)
LDL Cholesterol: 57 mg/dL (ref 0–99)
NonHDL: 69.28
Total CHOL/HDL Ratio: 2
Triglycerides: 62 mg/dL (ref 0.0–149.0)
VLDL: 12.4 mg/dL (ref 0.0–40.0)

## 2018-12-03 LAB — TSH: TSH: 0.59 u[IU]/mL (ref 0.35–4.50)

## 2018-12-03 LAB — PSA: PSA: 0.77 ng/mL (ref 0.10–4.00)

## 2018-12-03 MED ORDER — NYSTATIN-TRIAMCINOLONE 100000-0.1 UNIT/GM-% EX OINT: 1.0000 "application " | TOPICAL_OINTMENT | Freq: Two times a day (BID) | CUTANEOUS | 0 refills | Status: DC

## 2018-12-03 NOTE — Assessment & Plan Note (Signed)
Brother diagnosed at age 46, deceased

## 2018-12-03 NOTE — Progress Notes (Signed)
Subjective:    Patient ID: Brian Ray, male    DOB: November 15, 1970, 48 y.o.   MRN: [id number]  Patient presents today for complete physical and eval of rash  Rash This is a recurrent problem. The current episode started more than 1 month ago. The problem has been waxing and waning since onset. The affected locations include the abdomen. The rash is characterized by dryness, itchiness and scaling. He was exposed to nothing. Pertinent negatives include no congestion, cough, diarrhea, fatigue, fever, joint pain, shortness of breath or sore throat. Past treatments include topical steroids. The treatment provided mild relief. There is no history of allergies or eczema.   Sexual History (orientation,birth control, marital status, STD):Single, male partner, declined need for STD screen  Depression/Suicide: Depression screen [facility] 2/9 12/03/2018 02/22/2017  Decreased Interest 0 0  Down, Depressed, Hopeless 0 0  PHQ - 2 Score 0 0   Vision:upcoming appt  Dental:upcoming appt  Immunizations: (TDAP, Hep C screen, Pneumovax, Influenza, zoster)  Health Maintenance  Topic Date Due  . Flu Shot  06/10/2019*  . Tetanus Vaccine  01/11/2023  . HIV Screening  Completed  *Topic was postponed. The date shown is not the original due date.   Diet:regular, mostly fast food  Weight:  Wt Readings from Last 3 Encounters:  12/03/18 247 lb 6.4 oz (112.2 kg)  05/03/17 243 lb (110.2 kg)  02/22/17 232 lb (105.2 kg)   Exercise:mountain biking 2x/week, walking at work  Fall Risk: Fall Risk  02/22/2017  Falls in the past year? No   Medications and allergies reviewed with patient and updated if appropriate.  Patient Active Problem List   Diagnosis Date Noted  . Scaly patch rash 12/03/2018  . Family history of malignant neoplasm of colon in relative diagnosed when younger than 46 years of age 51/14/2018    Current Outpatient Medications on File Prior to Visit  Medication Sig Dispense Refill  .  albuterol (PROVENTIL HFA;VENTOLIN HFA) 108 (90 Base) MCG/ACT inhaler Inhale 1-2 puffs into the lungs every 6 (six) hours as needed. (Patient not taking: Reported on 12/03/2018) 1 Inhaler 0  . Chlorphen-PE-Acetaminophen 4-10-325 MG TABS Take 1 tablet by mouth every 12 (twelve) hours. (Patient not taking: Reported on 12/03/2018) 6 tablet 0  . Salicylic Acid 2 % PADS Apply 1 application topically daily at 12 noon. (Patient not taking: Reported on 05/03/2017)  0   No current facility-administered medications on file prior to visit.     Past Medical History:  Diagnosis Date  . Chicken pox     Past Surgical History:  Procedure Laterality Date  . HERNIA REPAIR Right    inguinal    Social History   Socioeconomic History  . Marital status: Single    Spouse name: Not on file  . Number of children: Not on file  . Years of education: Not on file  . Highest education level: Not on file  Occupational History  . Not on file  Social Needs  . Financial resource strain: Not on file  . Food insecurity    Worry: Not on file    Inability: Not on file  . Transportation needs    Medical: Not on file    Non-medical: Not on file  Tobacco Use  . Smoking status: Never Smoker  . Smokeless tobacco: Never Used  Substance and Sexual Activity  . Alcohol use: Yes    Comment: social  . Drug use: No  . Sexual activity: Yes    Birth control/protection:  None  Lifestyle  . Physical activity    Days per week: Not on file    Minutes per session: Not on file  . Stress: Not on file  Relationships  . Social Herbalist on phone: Not on file    Gets together: Not on file    Attends religious service: Not on file    Active member of club or organization: Not on file    Attends meetings of clubs or organizations: Not on file    Relationship status: Not on file  Other Topics Concern  . Not on file  Social History Narrative  . Not on file    Family History  Problem Relation Age of Onset  .  ALS Mother 37  . Cancer Brother 62       prostate cancer  . Alcohol abuse Father   . Cancer Maternal Uncle        unknown type.        Review of Systems  Constitutional: Negative for fatigue, fever, malaise/fatigue and weight loss.  HENT: Negative for congestion and sore throat.   Eyes:       Negative for visual changes  Respiratory: Negative for cough and shortness of breath.   Cardiovascular: Negative for chest pain, palpitations and leg swelling.  Gastrointestinal: Negative for blood in stool, constipation, diarrhea and heartburn.  Genitourinary: Negative for dysuria, flank pain, frequency, hematuria and urgency.  Musculoskeletal: Negative for back pain, falls, joint pain and myalgias.  Skin: Positive for itching and rash.  Neurological: Negative for dizziness, sensory change and headaches.  Endo/Heme/Allergies: Does not bruise/bleed easily.  Psychiatric/Behavioral: Negative for depression, substance abuse and suicidal ideas. The patient is not nervous/anxious and does not have insomnia.     Objective:   Vitals:   12/03/18 1024  BP: 116/82  Pulse: 66  Temp: 98.1 F (36.7 C)  SpO2: 97%    Body mass index is 36.01 kg/m.   Physical Examination:  Physical Exam Vitals signs reviewed.  Constitutional:      General: He is not in acute distress.    Appearance: He is well-developed.  HENT:     Right Ear: External ear normal.     Left Ear: External ear normal.     Nose: Nose normal.     Mouth/Throat:     Pharynx: No oropharyngeal exudate.  Eyes:     Conjunctiva/sclera: Conjunctivae normal.     Pupils: Pupils are equal, round, and reactive to light.  Neck:     Musculoskeletal: Normal range of motion and neck supple.  Cardiovascular:     Rate and Rhythm: Normal rate and regular rhythm.     Pulses: Normal pulses.     Heart sounds: Normal heart sounds.  Pulmonary:     Effort: Pulmonary effort is normal. No respiratory distress.     Breath sounds: Normal breath  sounds.  Chest:     Chest wall: No tenderness.  Abdominal:     General: Bowel sounds are normal.     Palpations: Abdomen is soft.     Tenderness: There is no abdominal tenderness. There is no guarding.    Musculoskeletal: Normal range of motion.  Lymphadenopathy:     Cervical: No cervical adenopathy.  Skin:    General: Skin is warm and dry.     Findings: Rash present. No erythema.       Neurological:     Mental Status: He is alert and oriented to person, place, and time.  Deep Tendon Reflexes: Reflexes are normal and symmetric.  Psychiatric:        Mood and Affect: Mood normal.        Behavior: Behavior normal.        Thought Content: Thought content normal.     ASSESSMENT and PLAN:  Sebert was seen today for annual exam.  Diagnoses and all orders for this visit:  Encounter for preventative adult health care exam with abnormal findings -     CBC -     Comprehensive metabolic panel -     Lipid panel -     TSH -     PSA -     Ambulatory referral to Gastroenterology  Encounter for lipid screening for cardiovascular disease -     Lipid panel  Family history of malignant neoplasm of colon in relative diagnosed when younger than 46 years of age -     Ambulatory referral to Gastroenterology  Encounter for prostate cancer screening -     PSA  Scaly patch rash -     nystatin-triamcinolone ointment (MYCOLOG); Apply 1 application topically 2 (two) times daily.    Family history of malignant neoplasm of colon in relative diagnosed when younger than 46 years of age 29 diagnosed at age 74, deceased  Scaly patch rash umbilicus Start mycolog ointment BID x 2weeks      Problem List Items Addressed This Visit      Musculoskeletal and Integument   Scaly patch rash    umbilicus Start mycolog ointment BID x 2weeks      Relevant Medications   nystatin-triamcinolone ointment (MYCOLOG)     Other   Family history of malignant neoplasm of colon in relative  diagnosed when younger than 46 years of age    65 diagnosed at age 49, deceased      Relevant Orders   Ambulatory referral to Gastroenterology    Other Visit Diagnoses    Encounter for preventative adult health care exam with abnormal findings    -  Primary   Relevant Orders   CBC   Comprehensive metabolic panel   Lipid panel   TSH   PSA   Ambulatory referral to Gastroenterology   Encounter for lipid screening for cardiovascular disease       Relevant Orders   Lipid panel   Encounter for prostate cancer screening       Relevant Orders   PSA      Follow up: Return if symptoms worsen or fail to improve.  Wilfred Lacy, NP

## 2018-12-03 NOTE — Assessment & Plan Note (Addendum)
umbilicus Start mycolog ointment BID x 2weeks

## 2018-12-03 NOTE — Patient Instructions (Addendum)
Normal lab results.  You will be contacted to schedule appt with GI  Start heart healthy diet and daily moderate intensity exercise.  Call office if no improvement in umbilical rash in 2weeks. Keep umbilicus dry at all times.  Preventive Care 14-46 Years Old, Male Preventive care refers to lifestyle choices and visits with your health care provider that can promote health and wellness. This includes:  A yearly physical exam. This is also called an annual well check.  Regular dental and eye exams.  Immunizations.  Screening for certain conditions.  Healthy lifestyle choices, such as eating a healthy diet, getting regular exercise, not using drugs or products that contain nicotine and tobacco, and limiting alcohol use. What can I expect for my preventive care visit? Physical exam Your health care provider will check:  Height and weight. These may be used to calculate body mass index (BMI), which is a measurement that tells if you are at a healthy weight.  Heart rate and blood pressure.  Your skin for abnormal spots. Counseling Your health care provider may ask you questions about:  Alcohol, tobacco, and drug use.  Emotional well-being.  Home and relationship well-being.  Sexual activity.  Eating habits.  Work and work Statistician. What immunizations do I need?  Influenza (flu) vaccine  This is recommended every year. Tetanus, diphtheria, and pertussis (Tdap) vaccine  You may need a Td booster every 10 years. Varicella (chickenpox) vaccine  You may need this vaccine if you have not already been vaccinated. Zoster (shingles) vaccine  You may need this after age 43. Measles, mumps, and rubella (MMR) vaccine  You may need at least one dose of MMR if you were born in 1957 or later. You may also need a second dose. Pneumococcal conjugate (PCV13) vaccine  You may need this if you have certain conditions and were not previously vaccinated. Pneumococcal  polysaccharide (PPSV23) vaccine  You may need one or two doses if you smoke cigarettes or if you have certain conditions. Meningococcal conjugate (MenACWY) vaccine  You may need this if you have certain conditions. Hepatitis A vaccine  You may need this if you have certain conditions or if you travel or work in places where you may be exposed to hepatitis A. Hepatitis B vaccine  You may need this if you have certain conditions or if you travel or work in places where you may be exposed to hepatitis B. Haemophilus influenzae type b (Hib) vaccine  You may need this if you have certain risk factors. Human papillomavirus (HPV) vaccine  If recommended by your health care provider, you may need three doses over 6 months. You may receive vaccines as individual doses or as more than one vaccine together in one shot (combination vaccines). Talk with your health care provider about the risks and benefits of combination vaccines. What tests do I need? Blood tests  Lipid and cholesterol levels. These may be checked every 5 years, or more frequently if you are over 71 years old.  Hepatitis C test.  Hepatitis B test. Screening  Lung cancer screening. You may have this screening every year starting at age 44 if you have a 30-pack-year history of smoking and currently smoke or have quit within the past 15 years.  Prostate cancer screening. Recommendations will vary depending on your family history and other risks.  Colorectal cancer screening. All adults should have this screening starting at age 19 and continuing until age 51. Your health care provider may recommend screening at age  45 if you are at increased risk. You will have tests every 1-10 years, depending on your results and the type of screening test.  Diabetes screening. This is done by checking your blood sugar (glucose) after you have not eaten for a while (fasting). You may have this done every 1-3 years.  Sexually transmitted  disease (STD) testing. Follow these instructions at home: Eating and drinking  Eat a diet that includes fresh fruits and vegetables, whole grains, lean protein, and low-fat dairy products.  Take vitamin and mineral supplements as recommended by your health care provider.  Do not drink alcohol if your health care provider tells you not to drink.  If you drink alcohol: ? Limit how much you have to 0-2 drinks a day. ? Be aware of how much alcohol is in your drink. In the [place] one drink equals one 12 oz bottle of beer (355 mL), one 5 oz glass of wine (148 mL), or one 1 oz glass of hard liquor (44 mL). Lifestyle  Take daily care of your teeth and gums.  Stay active. Exercise for at least 30 minutes on 5 or more days each week.  Do not use any products that contain nicotine or tobacco, such as cigarettes, e-cigarettes, and chewing tobacco. If you need help quitting, ask your health care provider.  If you are sexually active, practice safe sex. Use a condom or other form of protection to prevent STIs (sexually transmitted infections).  Talk with your health care provider about taking a low-dose aspirin every day starting at age 78. What's next?  Go to your health care provider once a year for a well check visit.  Ask your health care provider how often you should have your eyes and teeth checked.  Stay up to date on all vaccines. This information is not intended to replace advice given to you by your health care provider. Make sure you discuss any questions you have with your health care provider. Document Released: 03/25/2015 Document Revised: 02/20/2018 Document Reviewed: 02/20/2018 Elsevier Patient Education  2020 [employer].

## 2018-12-04 ENCOUNTER — Encounter: Payer: Self-pay | Admitting: Gastroenterology

## 2019-01-06 ENCOUNTER — Ambulatory Visit: Payer: BC Managed Care – PPO | Admitting: Gastroenterology

## 2019-01-06 ENCOUNTER — Encounter: Payer: Self-pay | Admitting: Gastroenterology

## 2019-01-06 VITALS — BP 124/98 | HR 72 | Temp 97.9°F | Ht 68.25 in | Wt 246.2 lb

## 2019-01-06 DIAGNOSIS — Z1159 Encounter for screening for other viral diseases: Secondary | ICD-10-CM

## 2019-01-06 DIAGNOSIS — Z800 Family history of malignant neoplasm of digestive organs: Secondary | ICD-10-CM | POA: Diagnosis not present

## 2019-01-06 MED ORDER — NA SULFATE-K SULFATE-MG SULF 17.5-3.13-1.6 GM/177ML PO SOLN: 1.0000 | Freq: Once | ORAL | 0 refills | Status: AC

## 2019-01-06 NOTE — Progress Notes (Signed)
History of Present Illness: This is a 48 year old male referred by Nche, Charlene Brooke, NP for the evaluation of a family history of colon cancer.  His brother was diagnosed with colon cancer at age 79 and passed away at age 60.  Patient has no gastrointestinal complaints.  No prior colonoscopy.  Denies weight loss, abdominal pain, constipation, diarrhea, change in stool caliber, melena, hematochezia, nausea, vomiting, dysphagia, reflux symptoms, chest pain.     No Known Allergies Outpatient Medications Prior to Visit  Medication Sig Dispense Refill  . nystatin-triamcinolone ointment (MYCOLOG) Apply 1 application topically 2 (two) times daily. 30 g 0  . albuterol (PROVENTIL HFA;VENTOLIN HFA) 108 (90 Base) MCG/ACT inhaler Inhale 1-2 puffs into the lungs every 6 (six) hours as needed. (Patient not taking: Reported on 12/03/2018) 1 Inhaler 0  . Chlorphen-PE-Acetaminophen 4-10-325 MG TABS Take 1 tablet by mouth every 12 (twelve) hours. (Patient not taking: Reported on 12/03/2018) 6 tablet 0  . Salicylic Acid 2 % PADS Apply 1 application topically daily at 12 noon. (Patient not taking: Reported on 05/03/2017)  0   No facility-administered medications prior to visit.    Past Medical History:  Diagnosis Date  . Chicken pox    Past Surgical History:  Procedure Laterality Date  . INGUINAL HERNIA REPAIR Right    Social History   Socioeconomic History  . Marital status: Single    Spouse name: Not on file  . Number of children: 1  . Years of education: Not on file  . Highest education level: Not on file  Occupational History  . Not on file  Social Needs  . Financial resource strain: Not on file  . Food insecurity    Worry: Not on file    Inability: Not on file  . Transportation needs    Medical: Not on file    Non-medical: Not on file  Tobacco Use  . Smoking status: Current Some Day Smoker    Types: Cigars    Start date: 2019  . Smokeless tobacco: Never Used  Substance and  Sexual Activity  . Alcohol use: Yes    Comment: social  . Drug use: No  . Sexual activity: Yes    Birth control/protection: None  Lifestyle  . Physical activity    Days per week: Not on file    Minutes per session: Not on file  . Stress: Not on file  Relationships  . Social Herbalist on phone: Not on file    Gets together: Not on file    Attends religious service: Not on file    Active member of club or organization: Not on file    Attends meetings of clubs or organizations: Not on file    Relationship status: Not on file  Other Topics Concern  . Not on file  Social History Narrative  . Not on file   Family History  Problem Relation Age of Onset  . ALS Mother 68  . Colon cancer Brother   . Alcohol abuse Father   . Cancer Maternal Uncle        unknown type.       Review of Systems: Pertinent positive and negative review of systems were noted in the above HPI section. All other review of systems were otherwise negative.   Physical Exam: General: Well developed, well nourished, no acute distress Head: Normocephalic and atraumatic Eyes:  sclerae anicteric, EOMI Ears: Normal auditory acuity Mouth: No deformity or lesions Neck:  Supple, no masses or thyromegaly Lungs: Clear throughout to auscultation Heart: Regular rate and rhythm; no murmurs, rubs or bruits Abdomen: Soft, non tender and non distended. No masses, hepatosplenomegaly or hernias noted. Normal Bowel sounds Rectal: Deferred to colonoscopy  Musculoskeletal: Symmetrical with no gross deformities  Skin: No lesions on visible extremities Pulses:  Normal pulses noted Extremities: No clubbing, cyanosis, edema or deformities noted Neurological: Alert oriented x 4, grossly nonfocal Cervical Nodes:  No significant cervical adenopathy Inguinal Nodes: No significant inguinal adenopathy Psychological:  Alert and cooperative. Normal mood and affect   Assessment and Recommendations:  1. Family history of  colon cancer, brother in mid 15s. Schedule colonoscopy. The risks (including bleeding, perforation, infection, missed lesions, medication reactions and possible hospitalization or surgery if complications occur), benefits, and alternatives to colonoscopy with possible biopsy and possible polypectomy were discussed with the patient and they consent to proceed.     cc: Flossie Buffy, NP [address] [place],  [place] [address]

## 2019-01-06 NOTE — Patient Instructions (Signed)
You have been scheduled for a colonoscopy. Please follow written instructions given to you at your visit today.  Please pick up your prep supplies at the pharmacy within the next 1-3 days. If you use inhalers (even only as needed), please bring them with you on the day of your procedure. Your physician has requested that you go to www.startemmi.com and enter the access code given to you at your visit today. This web site gives a general overview about your procedure. However, you should still follow specific instructions given to you by our office regarding your preparation for the procedure.  Normal BMI (Body Mass Index- based on height and weight) is between 19 and 25. Your BMI today is Body mass index is 37.17 kg/m. Marland Kitchen Please consider follow up  regarding your BMI with your Primary Care Provider.  Thank you for choosing me and [facility].  Pricilla Riffle. Dagoberto Ligas., MD., Marval Regal

## 2019-01-20 ENCOUNTER — Other Ambulatory Visit: Payer: Self-pay | Admitting: Gastroenterology

## 2019-01-20 ENCOUNTER — Ambulatory Visit (INDEPENDENT_AMBULATORY_CARE_PROVIDER_SITE_OTHER): Payer: BC Managed Care – PPO

## 2019-01-20 DIAGNOSIS — Z1159 Encounter for screening for other viral diseases: Secondary | ICD-10-CM | POA: Diagnosis not present

## 2019-01-21 LAB — SARS CORONAVIRUS 2 (TAT 6-24 HRS): SARS Coronavirus 2: NEGATIVE

## 2019-01-22 ENCOUNTER — Encounter: Payer: Self-pay | Admitting: Gastroenterology

## 2019-01-23 ENCOUNTER — Other Ambulatory Visit: Payer: Self-pay | Admitting: Gastroenterology

## 2019-01-23 ENCOUNTER — Encounter: Payer: Self-pay | Admitting: Gastroenterology

## 2019-01-23 ENCOUNTER — Telehealth: Payer: Self-pay | Admitting: *Deleted

## 2019-01-23 ENCOUNTER — Other Ambulatory Visit: Payer: Self-pay

## 2019-01-23 ENCOUNTER — Ambulatory Visit (AMBULATORY_SURGERY_CENTER): Payer: BC Managed Care – PPO | Admitting: Gastroenterology

## 2019-01-23 VITALS — BP 101/64 | HR 66 | Temp 98.4°F | Resp 17 | Ht 68.25 in | Wt 246.0 lb

## 2019-01-23 DIAGNOSIS — D123 Benign neoplasm of transverse colon: Secondary | ICD-10-CM | POA: Diagnosis not present

## 2019-01-23 DIAGNOSIS — Z800 Family history of malignant neoplasm of digestive organs: Secondary | ICD-10-CM | POA: Diagnosis not present

## 2019-01-23 DIAGNOSIS — Z1211 Encounter for screening for malignant neoplasm of colon: Secondary | ICD-10-CM

## 2019-01-23 MED ORDER — SODIUM CHLORIDE 0.9 % IV SOLN: 500.0000 mL | Freq: Once | INTRAVENOUS | Status: DC

## 2019-01-23 NOTE — Op Note (Signed)
[facility] Patient Name: Brian Ray Procedure Date: 01/23/2019 1:05 PM MRN: [id number] Endoscopist: Ladene Artist , MD Age: 48 Referring MD:  Date of Birth: [birth year] Gender: Male Account #: [id number] Procedure:                Colonoscopy Indications:              Screening in patient at increased risk: Family                            history of 1st-degree relative with colorectal                            cancer before age 65 years Medicines:                Monitored Anesthesia Care Procedure:                Pre-Anesthesia Assessment:                           - Prior to the procedure, a History and Physical                            was performed, and patient medications and                            allergies were reviewed. The patient's tolerance of                            previous anesthesia was also reviewed. The risks                            and benefits of the procedure and the sedation                            options and risks were discussed with the patient.                            All questions were answered, and informed consent                            was obtained. Prior Anticoagulants: The patient has                            taken no previous anticoagulant or antiplatelet                            agents. ASA Grade Assessment: II - A patient with                            mild systemic disease. After reviewing the risks                            and benefits, the patient was deemed in  satisfactory condition to undergo the procedure.                           After obtaining informed consent, the colonoscope                            was passed under direct vision. Throughout the                            procedure, the patient's blood pressure, pulse, and                            oxygen saturations were monitored continuously. The                            Colonoscope was introduced through the  anus and                            advanced to the the cecum, identified by                            appendiceal orifice and ileocecal valve. The                            ileocecal valve, appendiceal orifice, and rectum                            were photographed. The quality of the bowel                            preparation was excellent. The colonoscopy was                            performed without difficulty. The patient tolerated                            the procedure well. Scope In: 1:27:49 PM Scope Out: 1:40:50 PM Scope Withdrawal Time: 0 hours 10 minutes 21 seconds  Total Procedure Duration: 0 hours 13 minutes 1 second  Findings:                 The perianal and digital rectal examinations were                            normal.                           A 4 mm polyp was found in the transverse colon. The                            polyp was sessile. The polyp was removed with a                            cold biopsy forceps. Resection and retrieval were  complete.                           The exam was otherwise without abnormality on                            direct and retroflexion views. Complications:            No immediate complications. Estimated blood loss:                            None. Estimated Blood Loss:     Estimated blood loss: none. Impression:               - One 4 mm polyp in the transverse colon, removed                            with a cold biopsy forceps. Resected and retrieved.                           - The examination was otherwise normal on direct                            and retroflexion views. Recommendation:           - Repeat colonoscopy in 5 years for surveillance /                            screening.                           - Patient has a contact number available for                            emergencies. The signs and symptoms of potential                            delayed complications were  discussed with the                            patient. Return to normal activities tomorrow.                            Written discharge instructions were provided to the                            patient.                           - Resume previous diet.                           - Continue present medications.                           - Await pathology results. Ladene Artist, MD 01/23/2019 1:44:45 PM This report has been signed electronically.

## 2019-01-23 NOTE — Progress Notes (Signed)
Report to PACU, RN, vss, BBS= Clear.  

## 2019-01-23 NOTE — Patient Instructions (Signed)
YOU HAD AN ENDOSCOPIC PROCEDURE TODAY AT THE [facility]:   Refer to the procedure report that was given to you for any specific questions about what was found during the examination.  If the procedure report does not answer your questions, please call your gastroenterologist to clarify.  If you requested that your care partner not be given the details of your procedure findings, then the procedure report has been included in a sealed envelope for you to review at your convenience later.  YOU SHOULD EXPECT: Some feelings of bloating in the abdomen. Passage of more gas than usual.  Walking can help get rid of the air that was put into your GI tract during the procedure and reduce the bloating. If you had a lower endoscopy (such as a colonoscopy or flexible sigmoidoscopy) you may notice spotting of blood in your stool or on the toilet paper. If you underwent a bowel prep for your procedure, you may not have a normal bowel movement for a few days.  Please Note:  You might notice some irritation and congestion in your nose or some drainage.  This is from the oxygen used during your procedure.  There is no need for concern and it should clear up in a day or so.  SYMPTOMS TO REPORT IMMEDIATELY:   Following lower endoscopy (colonoscopy or flexible sigmoidoscopy):  Excessive amounts of blood in the stool  Significant tenderness or worsening of abdominal pains  Swelling of the abdomen that is new, acute  Fever of 100F or higher  For urgent or emergent issues, a gastroenterologist can be reached at any hour by calling [phone].   DIET:  We do recommend a small meal at first, but then you may proceed to your regular diet.  Drink plenty of fluids but you should avoid alcoholic beverages for 24 hours.  ACTIVITY:  You should plan to take it easy for the rest of today and you should NOT DRIVE or use heavy machinery until tomorrow (because of the sedation medicines used during the test).     FOLLOW UP: Our staff will call the number listed on your records 48-72 hours following your procedure to check on you and address any questions or concerns that you may have regarding the information given to you following your procedure. If we do not reach you, we will leave a message.  We will attempt to reach you two times.  During this call, we will ask if you have developed any symptoms of COVID 19. If you develop any symptoms (ie: fever, flu-like symptoms, shortness of breath, cough etc.) before then, please call [phone].  If you test positive for Covid 19 in the 2 weeks post procedure, please call and report this information to us.    If any biopsies were taken you will be contacted by phone or by letter within the next 1-3 weeks.  Please call us at [phone] if you have not heard about the biopsies in 3 weeks.    SIGNATURES/CONFIDENTIALITY: You and/or your care partner have signed paperwork which will be entered into your electronic medical record.  These signatures attest to the fact that that the information above on your After Visit Summary has been reviewed and is understood.  Full responsibility of the confidentiality of this discharge information lies with you and/or your care-partner. 

## 2019-01-23 NOTE — Progress Notes (Signed)
TEMP-JB  V/S-CW 

## 2019-01-23 NOTE — Telephone Encounter (Signed)
Received call from pt. Stating that someone called yesterday to see if I would like to come in earlier,pt. Will arrive at 1:00 pm for 2 pm.

## 2019-01-23 NOTE — Progress Notes (Signed)
Called to room to assist during endoscopic procedure.  Patient ID and intended procedure confirmed with present staff. Received instructions for my participation in the procedure from the performing physician.  

## 2019-01-27 ENCOUNTER — Telehealth: Payer: Self-pay | Admitting: *Deleted

## 2019-01-27 ENCOUNTER — Telehealth: Payer: Self-pay

## 2019-01-27 NOTE — Telephone Encounter (Signed)
  Follow up Call-  Call back number 01/23/2019  Post procedure Call Back phone  # [phone]  Permission to leave phone message Yes  Some recent data might be hidden     Left message

## 2019-01-27 NOTE — Telephone Encounter (Signed)
No answer for post procedure call back. Left message for patient to call with questions or concerns. SM 

## 2019-01-29 ENCOUNTER — Encounter: Payer: Self-pay | Admitting: Gastroenterology

## 2019-07-03 DIAGNOSIS — F17290 Nicotine dependence, other tobacco product, uncomplicated: Secondary | ICD-10-CM | POA: Diagnosis not present

## 2019-07-03 DIAGNOSIS — R1110 Vomiting, unspecified: Secondary | ICD-10-CM | POA: Diagnosis not present

## 2019-07-03 DIAGNOSIS — R197 Diarrhea, unspecified: Secondary | ICD-10-CM | POA: Diagnosis not present

## 2019-07-03 DIAGNOSIS — R112 Nausea with vomiting, unspecified: Secondary | ICD-10-CM | POA: Diagnosis not present

## 2020-03-23 ENCOUNTER — Other Ambulatory Visit: Payer: Self-pay

## 2020-03-24 ENCOUNTER — Encounter: Payer: Self-pay | Admitting: Nurse Practitioner

## 2020-03-24 ENCOUNTER — Ambulatory Visit (INDEPENDENT_AMBULATORY_CARE_PROVIDER_SITE_OTHER): Payer: BC Managed Care – PPO | Admitting: Nurse Practitioner

## 2020-03-24 VITALS — BP 120/82 | HR 80 | Temp 97.1°F | Ht 69.0 in | Wt 245.2 lb

## 2020-03-24 DIAGNOSIS — E6609 Other obesity due to excess calories: Secondary | ICD-10-CM | POA: Diagnosis not present

## 2020-03-24 DIAGNOSIS — Z125 Encounter for screening for malignant neoplasm of prostate: Secondary | ICD-10-CM

## 2020-03-24 DIAGNOSIS — Z13220 Encounter for screening for lipoid disorders: Secondary | ICD-10-CM

## 2020-03-24 DIAGNOSIS — R21 Rash and other nonspecific skin eruption: Secondary | ICD-10-CM | POA: Diagnosis not present

## 2020-03-24 DIAGNOSIS — Z0000 Encounter for general adult medical examination without abnormal findings: Secondary | ICD-10-CM

## 2020-03-24 DIAGNOSIS — Z136 Encounter for screening for cardiovascular disorders: Secondary | ICD-10-CM

## 2020-03-24 DIAGNOSIS — Z6836 Body mass index (BMI) 36.0-36.9, adult: Secondary | ICD-10-CM

## 2020-03-24 LAB — LIPID PANEL
Cholesterol: 129 mg/dL (ref 0–200)
HDL: 59.1 mg/dL (ref >39.00)
LDL Cholesterol: 56 mg/dL (ref 0–99)
NonHDL: 69.74
Total CHOL/HDL Ratio: 2
Triglycerides: 68 mg/dL (ref 0.0–149.0)
VLDL: 13.6 mg/dL (ref 0.0–40.0)

## 2020-03-24 LAB — COMPREHENSIVE METABOLIC PANEL
ALT: 19 U/L (ref 0–53)
AST: 13 U/L (ref 0–37)
Albumin: 4.2 g/dL (ref 3.5–5.2)
Alkaline Phosphatase: 63 U/L (ref 39–117)
BUN: 10 mg/dL (ref 6–23)
CO2: 30 mEq/L (ref 19–32)
Calcium: 9.4 mg/dL (ref 8.4–10.5)
Chloride: 104 mEq/L (ref 96–112)
Creatinine, Ser: 0.93 mg/dL (ref 0.40–1.50)
GFR: 97.73 mL/min (ref >60.00)
Glucose, Bld: 92 mg/dL (ref 70–99)
Potassium: 4.3 mEq/L (ref 3.5–5.1)
Sodium: 139 mEq/L (ref 135–145)
Total Bilirubin: 0.7 mg/dL (ref 0.2–1.2)
Total Protein: 7.4 g/dL (ref 6.0–8.3)

## 2020-03-24 LAB — PSA: PSA: 0.85 ng/mL (ref 0.10–4.00)

## 2020-03-24 LAB — CBC
HCT: 41.6 % (ref 39.0–52.0)
Hemoglobin: 13.5 g/dL (ref 13.0–17.0)
MCHC: 32.4 g/dL (ref 30.0–36.0)
MCV: 86.9 fl (ref 78.0–100.0)
Platelets: 385 10*3/uL (ref 150.0–400.0)
RBC: 4.79 Mil/uL (ref 4.22–5.81)
RDW: 14 % (ref 11.5–15.5)
WBC: 7.2 10*3/uL (ref 4.0–10.5)

## 2020-03-24 MED ORDER — NYSTATIN-TRIAMCINOLONE 100000-0.1 UNIT/GM-% EX OINT: 1.0000 "application " | TOPICAL_OINTMENT | Freq: Two times a day (BID) | CUTANEOUS | 1 refills | Status: DC | PRN

## 2020-03-24 NOTE — Assessment & Plan Note (Signed)
Chronic, waxing and waning, itching Location: umbilicus Resolves with lotrisone Refill sent

## 2020-03-24 NOTE — Patient Instructions (Signed)
Go to lab for blood draw  Start regular exercise and heart healthy diet.  Health Maintenance, Male Adopting a healthy lifestyle and getting preventive care are important in promoting health and wellness. Ask your health care provider about:  The right schedule for you to have regular tests and exams.  Things you can do on your own to prevent diseases and keep yourself healthy. What should I know about diet, weight, and exercise? Eat a healthy diet  Eat a diet that includes plenty of vegetables, fruits, low-fat dairy products, and lean protein.  Do not eat a lot of foods that are high in solid fats, added sugars, or sodium.   Maintain a healthy weight Body mass index (BMI) is a measurement that can be used to identify possible weight problems. It estimates body fat based on height and weight. Your health care provider can help determine your BMI and help you achieve or maintain a healthy weight. Get regular exercise Get regular exercise. This is one of the most important things you can do for your health. Most adults should:  Exercise for at least 150 minutes each week. The exercise should increase your heart rate and make you sweat (moderate-intensity exercise).  Do strengthening exercises at least twice a week. This is in addition to the moderate-intensity exercise.  Spend less time sitting. Even light physical activity can be beneficial. Watch cholesterol and blood lipids Have your blood tested for lipids and cholesterol at 48 years of age, then have this test every 5 years. You may need to have your cholesterol levels checked more often if:  Your lipid or cholesterol levels are high.  You are older than 48 years of age.  You are at high risk for heart disease. What should I know about cancer screening? Many types of cancers can be detected early and may often be prevented. Depending on your health history and family history, you may need to have cancer screening at various  ages. This may include screening for:  Colorectal cancer.  Prostate cancer.  Skin cancer.  Lung cancer. What should I know about heart disease, diabetes, and high blood pressure? Blood pressure and heart disease  High blood pressure causes heart disease and increases the risk of stroke. This is more likely to develop in people who have high blood pressure readings, are of African descent, or are overweight.  Talk with your health care provider about your target blood pressure readings.  Have your blood pressure checked: ? Every 3-5 years if you are 58-20 years of age. ? Every year if you are 51 years old or older.  If you are between the ages of 92 and 40 and are a current or former smoker, ask your health care provider if you should have a one-time screening for abdominal aortic aneurysm (AAA). Diabetes Have regular diabetes screenings. This checks your fasting blood sugar level. Have the screening done:  Once every three years after age 29 if you are at a normal weight and have a low risk for diabetes.  More often and at a younger age if you are overweight or have a high risk for diabetes. What should I know about preventing infection? Hepatitis B If you have a higher risk for hepatitis B, you should be screened for this virus. Talk with your health care provider to find out if you are at risk for hepatitis B infection. Hepatitis C Blood testing is recommended for:  Everyone born from [birth year] through 1965.  Anyone with known  risk factors for hepatitis C. Sexually transmitted infections (STIs)  You should be screened each year for STIs, including gonorrhea and chlamydia, if: ? You are sexually active and are younger than 49 years of age. ? You are older than 48 years of age and your health care provider tells you that you are at risk for this type of infection. ? Your sexual activity has changed since you were last screened, and you are at increased risk for chlamydia or  gonorrhea. Ask your health care provider if you are at risk.  Ask your health care provider about whether you are at high risk for HIV. Your health care provider may recommend a prescription medicine to help prevent HIV infection. If you choose to take medicine to prevent HIV, you should first get tested for HIV. You should then be tested every 3 months for as long as you are taking the medicine. Follow these instructions at home: Lifestyle  Do not use any products that contain nicotine or tobacco, such as cigarettes, e-cigarettes, and chewing tobacco. If you need help quitting, ask your health care provider.  Do not use street drugs.  Do not share needles.  Ask your health care provider for help if you need support or information about quitting drugs. Alcohol use  Do not drink alcohol if your health care provider tells you not to drink.  If you drink alcohol: ? Limit how much you have to 0-2 drinks a day. ? Be aware of how much alcohol is in your drink. In the [place] one drink equals one 12 oz bottle of beer (355 mL), one 5 oz glass of wine (148 mL), or one 1 oz glass of hard liquor (44 mL). General instructions  Schedule regular health, dental, and eye exams.  Stay current with your vaccines.  Tell your health care provider if: ? You often feel depressed. ? You have ever been abused or do not feel safe at home. Summary  Adopting a healthy lifestyle and getting preventive care are important in promoting health and wellness.  Follow your health care provider's instructions about healthy diet, exercising, and getting tested or screened for diseases.  Follow your health care provider's instructions on monitoring your cholesterol and blood pressure. This information is not intended to replace advice given to you by your health care provider. Make sure you discuss any questions you have with your health care provider. Document Revised: 02/19/2018 Document Reviewed:  02/19/2018 Elsevier Patient Education  2021 [employer].

## 2020-03-24 NOTE — Progress Notes (Signed)
Subjective:    Patient ID: Brian Ray, male    DOB: [birth year], 50 y.o.   MRN: [id number]  Patient presents today for CPE   HPI Scaly patch rash Chronic, waxing and waning, itching Location: umbilicus Resolves with lotrisone Refill sent  Sexual History (orientation,birth control, marital status, STD):sexually active, declines need for STD screen  Depression/Suicide: Depression screen [facility] 2/9 03/24/2020 12/03/2018 02/22/2017  Decreased Interest 0 0 0  Down, Depressed, Hopeless 0 0 0  PHQ - 2 Score 0 0 0   Vision:up to date  Dental:up to date  Immunizations: (TDAP, Hep C screen, Pneumovax, Influenza, zoster)  Health Maintenance  Topic Date Due  . COVID-19 Vaccine (1) Never done  . Flu Shot  06/09/2020*  .  Hepatitis C: One time screening is recommended by Center for Disease Control  (CDC) for  adults born from [birth year] through 1965.   03/24/2021*  . Tetanus Vaccine  01/11/2023  . Colon Cancer Screening  01/23/2024  . HIV Screening  Completed  *Topic was postponed. The date shown is not the original due date.   Diet:regular. Exercise: none Weight:  Wt Readings from Last 3 Encounters:  03/24/20 245 lb 3.2 oz (111.2 kg)  01/23/19 246 lb (111.6 kg)  01/06/19 246 lb 4 oz (111.7 kg)   Fall Risk: Fall Risk  02/22/2017  Falls in the past year? No   Medications and allergies reviewed with patient and updated if appropriate.  Patient Active Problem List   Diagnosis Date Noted  . Scaly patch rash 12/03/2018  . Family history of malignant neoplasm of colon in relative diagnosed when younger than 48 years of age 26/14/2018    No current outpatient medications on file prior to visit.   No current facility-administered medications on file prior to visit.    Past Medical History:  Diagnosis Date  . Chicken pox     Past Surgical History:  Procedure Laterality Date  . INGUINAL HERNIA REPAIR Right     Social History   Socioeconomic History  . Marital status: Single     Spouse name: Not on file  . Number of children: 1  . Years of education: Not on file  . Highest education level: Not on file  Occupational History  . Not on file  Tobacco Use  . Smoking status: Current Some Day Smoker    Types: Cigars    Start date: 2019  . Smokeless tobacco: Never Used  Vaping Use  . Vaping Use: Never used  Substance and Sexual Activity  . Alcohol use: Yes    Comment: social  . Drug use: No  . Sexual activity: Yes    Birth control/protection: None  Other Topics Concern  . Not on file  Social History Narrative  . Not on file   Social Determinants of Health   Financial Resource Strain: Not on file  Food Insecurity: Not on file  Transportation Needs: Not on file  Physical Activity: Not on file  Stress: Not on file  Social Connections: Not on file    Family History  Problem Relation Age of Onset  . ALS Mother 43  . Colon cancer Brother   . Alcohol abuse Father   . Cancer Maternal Uncle        unknown type.  . Esophageal cancer Neg Hx   . Rectal cancer Neg Hx   . Stomach cancer Neg Hx         Review of Systems  Constitutional: Negative for fever, malaise/fatigue  and weight loss.  HENT: Negative for congestion and sore throat.   Eyes:       Negative for visual changes  Respiratory: Negative for cough and shortness of breath.   Cardiovascular: Negative for chest pain, palpitations and leg swelling.  Gastrointestinal: Negative for blood in stool, constipation, diarrhea and heartburn.  Genitourinary: Negative for dysuria, frequency and urgency.  Musculoskeletal: Negative for falls, joint pain and myalgias.  Skin: Positive for rash.  Neurological: Negative for dizziness, sensory change and headaches.  Endo/Heme/Allergies: Does not bruise/bleed easily.  Psychiatric/Behavioral: Negative for depression, substance abuse and suicidal ideas. The patient is not nervous/anxious.     Objective:   Vitals:   03/24/20 0926  BP: 120/82  Pulse: 80   Temp: (!) 97.1 F (36.2 C)  SpO2: 99%    Body mass index is 36.21 kg/m.   Physical Examination:  Physical Exam Vitals reviewed.  Constitutional:      General: He is not in acute distress.    Appearance: He is well-developed. He is obese.  HENT:     Right Ear: Tympanic membrane, ear canal and external ear normal.     Left Ear: Tympanic membrane, ear canal and external ear normal.     Mouth/Throat:     Mouth: Oropharynx is clear and moist.  Eyes:     Extraocular Movements: Extraocular movements intact and EOM normal.     Conjunctiva/sclera: Conjunctivae normal.  Cardiovascular:     Rate and Rhythm: Normal rate and regular rhythm.     Heart sounds: Normal heart sounds.  Pulmonary:     Effort: Pulmonary effort is normal. No respiratory distress.     Breath sounds: Normal breath sounds.  Chest:     Chest wall: No tenderness.  Abdominal:     General: Bowel sounds are normal.     Palpations: Abdomen is soft.  Musculoskeletal:        General: No edema. Normal range of motion.     Cervical back: Normal range of motion and neck supple.  Lymphadenopathy:     Cervical: No cervical adenopathy.  Skin:    General: Skin is warm and dry.  Neurological:     Mental Status: He is alert and oriented to person, place, and time.     Deep Tendon Reflexes: Reflexes are normal and symmetric.  Psychiatric:        Mood and Affect: Mood normal.        Behavior: Behavior normal.        Thought Content: Thought content normal.    ASSESSMENT and PLAN: This visit occurred during the SARS-CoV-2 public health emergency.  Safety protocols were in place, including screening questions prior to the visit, additional usage of staff PPE, and extensive cleaning of exam room while observing appropriate contact time as indicated for disinfecting solutions.   Brian Ray was seen today for annual exam.  Diagnoses and all orders for this visit:  Preventative health care -     Comprehensive metabolic  panel -     Lipid panel -     CBC -     PSA  Scaly patch rash -     nystatin-triamcinolone ointment (MYCOLOG); Apply 1 application topically 2 (two) times daily as needed.  Encounter for lipid screening for cardiovascular disease -     Lipid panel  Class 2 obesity due to excess calories with body mass index (BMI) of 36.0 to 36.9 in adult, unspecified whether serious comorbidity present  Encounter for prostate cancer screening -  PSA      Problem List Items Addressed This Visit      Musculoskeletal and Integument   Scaly patch rash    Chronic, waxing and waning, itching Location: umbilicus Resolves with lotrisone Refill sent      Relevant Medications   nystatin-triamcinolone ointment (MYCOLOG)    Other Visit Diagnoses    Preventative health care    -  Primary   Relevant Orders   Comprehensive metabolic panel (Completed)   Lipid panel (Completed)   CBC (Completed)   PSA (Completed)   Encounter for lipid screening for cardiovascular disease       Relevant Orders   Lipid panel (Completed)   Class 2 obesity due to excess calories with body mass index (BMI) of 36.0 to 36.9 in adult, unspecified whether serious comorbidity present       Encounter for prostate cancer screening       Relevant Orders   PSA (Completed)      Follow up: Return in about 1 year (around 03/24/2021) for CPE (fasting).  Alysia Penna, NP

## 2020-07-18 DIAGNOSIS — S82432A Displaced oblique fracture of shaft of left fibula, initial encounter for closed fracture: Secondary | ICD-10-CM | POA: Diagnosis not present

## 2020-07-18 DIAGNOSIS — M25572 Pain in left ankle and joints of left foot: Secondary | ICD-10-CM | POA: Diagnosis not present

## 2020-07-18 DIAGNOSIS — Y9351 Activity, roller skating (inline) and skateboarding: Secondary | ICD-10-CM | POA: Diagnosis not present

## 2020-07-18 DIAGNOSIS — G8911 Acute pain due to trauma: Secondary | ICD-10-CM | POA: Diagnosis not present

## 2020-07-18 DIAGNOSIS — W19XXXA Unspecified fall, initial encounter: Secondary | ICD-10-CM | POA: Diagnosis not present

## 2020-07-18 DIAGNOSIS — S8265XA Nondisplaced fracture of lateral malleolus of left fibula, initial encounter for closed fracture: Secondary | ICD-10-CM | POA: Diagnosis not present

## 2020-07-18 DIAGNOSIS — M25472 Effusion, left ankle: Secondary | ICD-10-CM | POA: Diagnosis not present

## 2020-07-18 DIAGNOSIS — F17290 Nicotine dependence, other tobacco product, uncomplicated: Secondary | ICD-10-CM | POA: Diagnosis not present

## 2020-07-18 DIAGNOSIS — S82832A Other fracture of upper and lower end of left fibula, initial encounter for closed fracture: Secondary | ICD-10-CM | POA: Diagnosis not present

## 2020-07-19 DIAGNOSIS — S8262XA Displaced fracture of lateral malleolus of left fibula, initial encounter for closed fracture: Secondary | ICD-10-CM | POA: Diagnosis not present

## 2020-07-25 DIAGNOSIS — S93432A Sprain of tibiofibular ligament of left ankle, initial encounter: Secondary | ICD-10-CM | POA: Diagnosis not present

## 2020-07-25 DIAGNOSIS — S8262XA Displaced fracture of lateral malleolus of left fibula, initial encounter for closed fracture: Secondary | ICD-10-CM | POA: Diagnosis not present

## 2020-08-01 DIAGNOSIS — Z8781 Personal history of (healed) traumatic fracture: Secondary | ICD-10-CM | POA: Diagnosis not present

## 2020-08-01 DIAGNOSIS — M25572 Pain in left ankle and joints of left foot: Secondary | ICD-10-CM | POA: Diagnosis not present

## 2020-08-01 DIAGNOSIS — Z9889 Other specified postprocedural states: Secondary | ICD-10-CM | POA: Diagnosis not present

## 2020-08-19 DIAGNOSIS — Z8781 Personal history of (healed) traumatic fracture: Secondary | ICD-10-CM | POA: Diagnosis not present

## 2020-08-19 DIAGNOSIS — Z4889 Encounter for other specified surgical aftercare: Secondary | ICD-10-CM | POA: Diagnosis not present

## 2020-08-29 DIAGNOSIS — M25572 Pain in left ankle and joints of left foot: Secondary | ICD-10-CM | POA: Diagnosis not present

## 2020-08-29 DIAGNOSIS — M79605 Pain in left leg: Secondary | ICD-10-CM | POA: Diagnosis not present

## 2020-09-30 DIAGNOSIS — S8262XD Displaced fracture of lateral malleolus of left fibula, subsequent encounter for closed fracture with routine healing: Secondary | ICD-10-CM | POA: Diagnosis not present

## 2020-09-30 DIAGNOSIS — S8262XA Displaced fracture of lateral malleolus of left fibula, initial encounter for closed fracture: Secondary | ICD-10-CM | POA: Diagnosis not present

## 2020-10-11 DIAGNOSIS — S8262XD Displaced fracture of lateral malleolus of left fibula, subsequent encounter for closed fracture with routine healing: Secondary | ICD-10-CM | POA: Diagnosis not present

## 2020-10-18 DIAGNOSIS — M25572 Pain in left ankle and joints of left foot: Secondary | ICD-10-CM | POA: Diagnosis not present

## 2020-10-18 DIAGNOSIS — R6889 Other general symptoms and signs: Secondary | ICD-10-CM | POA: Diagnosis not present

## 2020-10-25 DIAGNOSIS — M25572 Pain in left ankle and joints of left foot: Secondary | ICD-10-CM | POA: Diagnosis not present

## 2020-10-25 DIAGNOSIS — R6889 Other general symptoms and signs: Secondary | ICD-10-CM | POA: Diagnosis not present

## 2020-11-01 DIAGNOSIS — M25572 Pain in left ankle and joints of left foot: Secondary | ICD-10-CM | POA: Diagnosis not present

## 2020-11-01 DIAGNOSIS — R6889 Other general symptoms and signs: Secondary | ICD-10-CM | POA: Diagnosis not present

## 2020-11-08 DIAGNOSIS — M25572 Pain in left ankle and joints of left foot: Secondary | ICD-10-CM | POA: Diagnosis not present

## 2020-11-08 DIAGNOSIS — R6889 Other general symptoms and signs: Secondary | ICD-10-CM | POA: Diagnosis not present

## 2020-11-15 DIAGNOSIS — R6889 Other general symptoms and signs: Secondary | ICD-10-CM | POA: Diagnosis not present

## 2020-11-15 DIAGNOSIS — M25572 Pain in left ankle and joints of left foot: Secondary | ICD-10-CM | POA: Diagnosis not present

## 2020-11-16 DIAGNOSIS — S8262XD Displaced fracture of lateral malleolus of left fibula, subsequent encounter for closed fracture with routine healing: Secondary | ICD-10-CM | POA: Diagnosis not present

## 2020-12-19 DIAGNOSIS — M25572 Pain in left ankle and joints of left foot: Secondary | ICD-10-CM | POA: Diagnosis not present

## 2020-12-19 DIAGNOSIS — S8262XD Displaced fracture of lateral malleolus of left fibula, subsequent encounter for closed fracture with routine healing: Secondary | ICD-10-CM | POA: Diagnosis not present

## 2020-12-19 DIAGNOSIS — Z9889 Other specified postprocedural states: Secondary | ICD-10-CM | POA: Diagnosis not present

## 2020-12-19 DIAGNOSIS — Z8781 Personal history of (healed) traumatic fracture: Secondary | ICD-10-CM | POA: Diagnosis not present

## 2021-01-30 ENCOUNTER — Other Ambulatory Visit: Payer: Self-pay | Admitting: Nurse Practitioner

## 2021-01-30 DIAGNOSIS — R21 Rash and other nonspecific skin eruption: Secondary | ICD-10-CM

## 2021-02-07 NOTE — Telephone Encounter (Signed)
Chart supports Rx Last seen 03/2020 Next OV 03/2021

## 2021-03-27 ENCOUNTER — Encounter: Payer: BC Managed Care – PPO | Admitting: Nurse Practitioner

## 2021-03-28 ENCOUNTER — Encounter: Payer: Self-pay | Admitting: Nurse Practitioner

## 2021-03-28 ENCOUNTER — Ambulatory Visit (INDEPENDENT_AMBULATORY_CARE_PROVIDER_SITE_OTHER): Payer: BC Managed Care – PPO | Admitting: Nurse Practitioner

## 2021-03-28 ENCOUNTER — Other Ambulatory Visit: Payer: Self-pay

## 2021-03-28 VITALS — BP 118/88 | HR 78 | Temp 97.5°F | Ht 68.25 in | Wt 250.0 lb

## 2021-03-28 DIAGNOSIS — Z0000 Encounter for general adult medical examination without abnormal findings: Secondary | ICD-10-CM | POA: Diagnosis not present

## 2021-03-28 DIAGNOSIS — Z125 Encounter for screening for malignant neoplasm of prostate: Secondary | ICD-10-CM | POA: Diagnosis not present

## 2021-03-28 DIAGNOSIS — Z13220 Encounter for screening for lipoid disorders: Secondary | ICD-10-CM | POA: Diagnosis not present

## 2021-03-28 DIAGNOSIS — L304 Erythema intertrigo: Secondary | ICD-10-CM

## 2021-03-28 DIAGNOSIS — Z136 Encounter for screening for cardiovascular disorders: Secondary | ICD-10-CM | POA: Diagnosis not present

## 2021-03-28 LAB — COMPREHENSIVE METABOLIC PANEL
ALT: 19 U/L (ref 0–53)
AST: 15 U/L (ref 0–37)
Albumin: 4.1 g/dL (ref 3.5–5.2)
Alkaline Phosphatase: 66 U/L (ref 39–117)
BUN: 11 mg/dL (ref 6–23)
CO2: 27 mEq/L (ref 19–32)
Calcium: 9.6 mg/dL (ref 8.4–10.5)
Chloride: 103 mEq/L (ref 96–112)
Creatinine, Ser: 0.85 mg/dL (ref 0.40–1.50)
GFR: 102.69 mL/min (ref >60.00)
Glucose, Bld: 87 mg/dL (ref 70–99)
Potassium: 4.1 mEq/L (ref 3.5–5.1)
Sodium: 139 mEq/L (ref 135–145)
Total Bilirubin: 0.6 mg/dL (ref 0.2–1.2)
Total Protein: 8 g/dL (ref 6.0–8.3)

## 2021-03-28 LAB — LIPID PANEL
Cholesterol: 147 mg/dL (ref 0–200)
HDL: 64.2 mg/dL (ref >39.00)
LDL Cholesterol: 63 mg/dL (ref 0–99)
NonHDL: 82.94
Total CHOL/HDL Ratio: 2
Triglycerides: 100 mg/dL (ref 0.0–149.0)
VLDL: 20 mg/dL (ref 0.0–40.0)

## 2021-03-28 LAB — TSH: TSH: 0.99 u[IU]/mL (ref 0.35–5.50)

## 2021-03-28 LAB — PSA: PSA: 0.92 ng/mL (ref 0.10–4.00)

## 2021-03-28 MED ORDER — CLOTRIMAZOLE 1 % EX CREA: 1.0000 "application " | TOPICAL_CREAM | Freq: Two times a day (BID) | CUTANEOUS | 1 refills | Status: DC

## 2021-03-28 NOTE — Progress Notes (Signed)
Subjective:    Patient ID: Brian Ray, male    DOB: May 12, 1970, 51 y.o.   MRN: [id number]  Patient presents today for CPE   HPI  Vision:will schedule Dental:up to date Diet:regular Exercise:limited due to left ankle fracture Weight:  Wt Readings from Last 3 Encounters:  03/28/21 250 lb (113.4 kg)  03/24/20 245 lb 3.2 oz (111.2 kg)  01/23/19 246 lb (111.6 kg)    Sexual History (orientation,birth control, marital status, STD):no need for STD screen. No change in GI/GU function. Up to date with colonoscopy  Depression/Suicide: Depression screen [facility] 2/9 03/28/2021 03/24/2020 12/03/2018 02/22/2017  Decreased Interest 0 0 0 0  Down, Depressed, Hopeless 0 0 0 0  PHQ - 2 Score 0 0 0 0   Immunizations: (TDAP, Hep C screen, Pneumovax, Influenza, zoster)  Health Maintenance  Topic Date Due   Flu Shot  06/09/2021*   Pneumococcal Vaccination (1 - PCV) 03/28/2022*   Hepatitis C Screening: USPSTF Recommendation to screen - Ages 18-79 yo.  03/28/2022*   Tetanus Vaccine  01/11/2023   Colon Cancer Screening  01/23/2024   HIV Screening  Completed   HPV Vaccine  Aged Out   COVID-19 Vaccine  Discontinued  *Topic was postponed. The date shown is not the original due date.   Fall Risk: Fall Risk  03/28/2021 02/22/2017  Falls in the past year? 1 No  Number falls in past yr: 0 -  Injury with Fall? 1 -  Risk for fall due to : History of fall(s) -  Follow up Falls evaluation completed -   Medications and allergies reviewed with patient and updated if appropriate.  Patient Active Problem List   Diagnosis Date Noted   Closed displaced fracture of lateral malleolus of left fibula 07/19/2020   Scaly patch rash 12/03/2018   Family history of malignant neoplasm of colon in relative diagnosed when younger than 49 years of age 30/14/2018    Current Outpatient Medications on File Prior to Visit  Medication Sig Dispense Refill   aspirin EC 81 MG tablet Take 81 mg by mouth daily. Swallow whole.      No current facility-administered medications on file prior to visit.   Past Medical History:  Diagnosis Date   Chicken pox    Past Surgical History:  Procedure Laterality Date   INGUINAL HERNIA REPAIR Right    Social History   Socioeconomic History   Marital status: Single    Spouse name: Not on file   Number of children: 1   Years of education: Not on file   Highest education level: Not on file  Occupational History   Not on file  Tobacco Use   Smoking status: Former    Types: Cigars    Start date: 2019    Quit date: 12/12/2020    Years since quitting: 0.2   Smokeless tobacco: Never  Vaping Use   Vaping Use: Never used  Substance and Sexual Activity   Alcohol use: Yes    Comment: social   Drug use: No   Sexual activity: Yes    Birth control/protection: None  Other Topics Concern   Not on file  Social History Narrative   Not on file   Social Determinants of Health   Financial Resource Strain: Not on file  Food Insecurity: Not on file  Transportation Needs: Not on file  Physical Activity: Not on file  Stress: Not on file  Social Connections: Not on file   Family History  Problem Relation Age  of Onset   ALS Mother 34   Colon cancer Brother    Alcohol abuse Father    Cancer Maternal Uncle        unknown type.   Esophageal cancer Neg Hx    Rectal cancer Neg Hx    Stomach cancer Neg Hx        Review of Systems  Constitutional:  Negative for fever, malaise/fatigue and weight loss.  HENT:  Negative for congestion and sore throat.   Eyes:        Negative for visual changes  Respiratory:  Negative for cough and shortness of breath.   Cardiovascular:  Negative for chest pain, palpitations and leg swelling.  Gastrointestinal:  Negative for blood in stool, constipation, diarrhea and heartburn.  Genitourinary:  Negative for dysuria, frequency and urgency.  Musculoskeletal:  Negative for falls, joint pain and myalgias.  Skin:  Negative for rash.   Neurological:  Negative for dizziness, sensory change and headaches.  Endo/Heme/Allergies:  Does not bruise/bleed easily.  Psychiatric/Behavioral:  Negative for depression, substance abuse and suicidal ideas. The patient is not nervous/anxious.    Objective:   Vitals:   03/28/21 1320  BP: 118/88  Pulse: 78  Temp: (!) 97.5 F (36.4 C)  SpO2: 98%    Body mass index is 37.73 kg/m.   Physical Examination:  Physical Exam Vitals reviewed.  Constitutional:      General: He is not in acute distress.    Appearance: He is well-developed.  HENT:     Right Ear: Tympanic membrane, ear canal and external ear normal.     Left Ear: Tympanic membrane, ear canal and external ear normal.  Eyes:     Extraocular Movements: Extraocular movements intact.     Conjunctiva/sclera: Conjunctivae normal.     Pupils: Pupils are equal, round, and reactive to light.  Cardiovascular:     Rate and Rhythm: Normal rate and regular rhythm.     Pulses: Normal pulses.     Heart sounds: Normal heart sounds.  Pulmonary:     Effort: Pulmonary effort is normal. No respiratory distress.     Breath sounds: Normal breath sounds.  Chest:     Chest wall: No tenderness.  Abdominal:     General: Bowel sounds are normal.     Palpations: Abdomen is soft.  Musculoskeletal:        General: Normal range of motion.     Cervical back: Normal range of motion and neck supple.     Right lower leg: No edema.     Left lower leg: No edema.  Skin:    General: Skin is warm and dry.     Findings: Rash present. Rash is macular.       Neurological:     Mental Status: He is alert and oriented to person, place, and time.     Deep Tendon Reflexes: Reflexes are normal and symmetric.  Psychiatric:        Mood and Affect: Mood normal.        Behavior: Behavior normal.        Thought Content: Thought content normal.   ASSESSMENT and PLAN: This visit occurred during the SARS-CoV-2 public health emergency.  Safety protocols  were in place, including screening questions prior to the visit, additional usage of staff PPE, and extensive cleaning of exam room while observing appropriate contact time as indicated for disinfecting solutions.   [name] was seen today for annual exam.  Diagnoses and all orders for this  visit:  Encounter for preventative adult health care exam with abnormal findings -     Comprehensive metabolic panel -     Lipid panel -     TSH -     PSA  Intertrigo -     clotrimazole (ANTIFUNGAL, CLOTRIMAZOLE,) 1 % cream; Apply 1 application topically 2 (two) times daily.  Encounter for lipid screening for cardiovascular disease -     Lipid panel  Encounter for prostate cancer screening -     PSA      Problem List Items Addressed This Visit   None Visit Diagnoses     Encounter for preventative adult health care exam with abnormal findings    -  Primary   Relevant Orders   Comprehensive metabolic panel   Lipid panel   TSH   PSA   Intertrigo       Relevant Medications   clotrimazole (ANTIFUNGAL, CLOTRIMAZOLE,) 1 % cream   Encounter for lipid screening for cardiovascular disease       Relevant Orders   Lipid panel   Encounter for prostate cancer screening       Relevant Orders   PSA       Follow up: Return in about 1 year (around 03/28/2022) for CPE (fasting).  Wilfred Lacy, NP

## 2021-03-28 NOTE — Patient Instructions (Addendum)
Use clotrimazole in groin region to treat rash. Avoid triamcinolone (steriod)  Go to lab for blood draw Schedule appt for annual eye exam  Preventive Care 14-49 Years Old, Male Preventive care refers to lifestyle choices and visits with your health care provider that can promote health and wellness. Preventive care visits are also called wellness exams. What can I expect for my preventive care visit? Counseling During your preventive care visit, your health care provider may ask about your: Medical history, including: Past medical problems. Family medical history. Current health, including: Emotional well-being. Home life and relationship well-being. Sexual activity. Lifestyle, including: Alcohol, nicotine or tobacco, and drug use. Access to firearms. Diet, exercise, and sleep habits. Safety issues such as seatbelt and bike helmet use. Sunscreen use. Work and work Statistician. Physical exam Your health care provider will check your: Height and weight. These may be used to calculate your BMI (body mass index). BMI is a measurement that tells if you are at a healthy weight. Waist circumference. This measures the distance around your waistline. This measurement also tells if you are at a healthy weight and may help predict your risk of certain diseases, such as type 2 diabetes and high blood pressure. Heart rate and blood pressure. Body temperature. Skin for abnormal spots. What immunizations do I need? Vaccines are usually given at various ages, according to a schedule. Your health care provider will recommend vaccines for you based on your age, medical history, and lifestyle or other factors, such as travel or where you work. What tests do I need? Screening Your health care provider may recommend screening tests for certain conditions. This may include: Lipid and cholesterol levels. Diabetes screening. This is done by checking your blood sugar (glucose) after you have not eaten  for a while (fasting). Hepatitis B test. Hepatitis C test. HIV (human immunodeficiency virus) test. STI (sexually transmitted infection) testing, if you are at risk. Lung cancer screening. Prostate cancer screening. Colorectal cancer screening. Talk with your health care provider about your test results, treatment options, and if necessary, the need for more tests. Follow these instructions at home: Eating and drinking  Eat a diet that includes fresh fruits and vegetables, whole grains, lean protein, and low-fat dairy products. Take vitamin and mineral supplements as recommended by your health care provider. Do not drink alcohol if your health care provider tells you not to drink. If you drink alcohol: Limit how much you have to 0-2 drinks a day. Know how much alcohol is in your drink. In the [place] one drink equals one 12 oz bottle of beer (355 mL), one 5 oz glass of wine (148 mL), or one 1 oz glass of hard liquor (44 mL). Lifestyle Brush your teeth every morning and night with fluoride toothpaste. Floss one time each day. Exercise for at least 30 minutes 5 or more days each week. Do not use any products that contain nicotine or tobacco. These products include cigarettes, chewing tobacco, and vaping devices, such as e-cigarettes. If you need help quitting, ask your health care provider. Do not use drugs. If you are sexually active, practice safe sex. Use a condom or other form of protection to prevent STIs. Take aspirin only as told by your health care provider. Make sure that you understand how much to take and what form to take. Work with your health care provider to find out whether it is safe and beneficial for you to take aspirin daily. Find healthy ways to manage stress, such as: Meditation,  yoga, or listening to music. Journaling. Talking to a trusted person. Spending time with friends and family. Minimize exposure to UV radiation to reduce your risk of skin  cancer. Safety Always wear your seat belt while driving or riding in a vehicle. Do not drive: If you have been drinking alcohol. Do not ride with someone who has been drinking. When you are tired or distracted. While texting. If you have been using any mind-altering substances or drugs. Wear a helmet and other protective equipment during sports activities. If you have firearms in your house, make sure you follow all gun safety procedures. What's next? Go to your health care provider once a year for an annual wellness visit. Ask your health care provider how often you should have your eyes and teeth checked. Stay up to date on all vaccines. This information is not intended to replace advice given to you by your health care provider. Make sure you discuss any questions you have with your health care provider. Document Revised: 08/24/2020 Document Reviewed: 08/24/2020 Elsevier Patient Education  [place].

## 2022-02-16 ENCOUNTER — Other Ambulatory Visit: Payer: Self-pay | Admitting: Nurse Practitioner

## 2022-02-16 DIAGNOSIS — R21 Rash and other nonspecific skin eruption: Secondary | ICD-10-CM

## 2022-02-27 ENCOUNTER — Telehealth: Payer: Self-pay

## 2022-02-27 NOTE — Patient Outreach (Signed)
  Care Coordination   Initial Visit Note   02/27/2022 Name: Brian Ray MRN: [id number] DOB: 10-15-1970  Brian Ray is a 51 y.o. year old male who sees Nche, Charlene Brooke, NP for primary care. I spoke with  Brian Ray by phone today.  What matters to the patients health and wellness today?  none    Goals Addressed             This Visit's Progress    COMPLETED: Care Coordination Activities-No follow up required       Care Coordination Interventions: Advised patient to schedule annual exam and flu vaccine.  Discussed [facility] services and support. Decline          SDOH assessments and interventions completed:  Yes  SDOH Interventions Today    Flowsheet Row Most Recent Value  SDOH Interventions   Housing Interventions Intervention Not Indicated  Transportation Interventions Intervention Not Indicated        Care Coordination Interventions:  Yes, provided   Follow up plan: No further intervention required.   Encounter Outcome:  Pt. Visit Completed   Jone Baseman, RN, MSN [facility] Care Management Coordinator Direct Line [phone]

## 2022-02-27 NOTE — Patient Instructions (Signed)
Visit Information  Thank you for taking time to visit with me today. Please don't hesitate to contact me if I can be of assistance to you.   Following are the goals we discussed today:   Goals Addressed             This Visit's Progress    COMPLETED: Care Coordination Activities-No follow up required       Care Coordination Interventions: Advised patient to schedule annual exam and flu vaccine.  Discussed [facility] services and support. Decline           If you are experiencing a Mental Health or [place] or need someone to talk to, please call the Suicide and Crisis Lifeline: 988   The patient verbalized understanding of instructions, educational materials, and care plan provided today and DECLINED offer to receive copy of patient instructions, educational materials, and care plan.   No further follow up required:    Jone Baseman, RN, MSN [facility] Care Management Coordinator Direct Line [phone]

## 2022-04-13 ENCOUNTER — Telehealth: Payer: Self-pay | Admitting: Nurse Practitioner

## 2022-04-13 ENCOUNTER — Encounter: Payer: BC Managed Care – PPO | Admitting: Nurse Practitioner

## 2022-04-13 NOTE — Telephone Encounter (Signed)
Pt was a no show for a cpe with Baldo Ash on 04/13/22, I sent a letter.  04/13/22 AD, Patient called at 8:55am same day and said he thought the appt was supposed to be at 2pm, He needed to reschedule.

## 2022-04-20 NOTE — Telephone Encounter (Signed)
03/27/2021 same day cancel/called 9am to cancel 9:30am appt   04/13/2022 same day cancel, pt called 8:55am to cancel 9:00am appt, fee generated, letter sent to pt via mychart and mail  Pt has rescheduled cpe for 06/04/2022

## 2022-06-04 ENCOUNTER — Other Ambulatory Visit: Payer: Self-pay | Admitting: Nurse Practitioner

## 2022-06-04 ENCOUNTER — Encounter: Payer: Self-pay | Admitting: Nurse Practitioner

## 2022-06-04 ENCOUNTER — Ambulatory Visit (INDEPENDENT_AMBULATORY_CARE_PROVIDER_SITE_OTHER): Payer: BC Managed Care – PPO | Admitting: Nurse Practitioner

## 2022-06-04 ENCOUNTER — Other Ambulatory Visit (HOSPITAL_COMMUNITY)
Admission: RE | Admit: 2022-06-04 | Discharge: 2022-06-04 | Disposition: A | Discharge status: Home / Self Care | Payer: BC Managed Care – PPO | Source: Ambulatory Visit | Attending: Nurse Practitioner | Admitting: Nurse Practitioner

## 2022-06-04 VITALS — BP 126/80 | HR 75 | Temp 98.4°F | Resp 16 | Ht 68.0 in | Wt 253.2 lb

## 2022-06-04 DIAGNOSIS — Z136 Encounter for screening for cardiovascular disorders: Secondary | ICD-10-CM | POA: Diagnosis not present

## 2022-06-04 DIAGNOSIS — Z1159 Encounter for screening for other viral diseases: Secondary | ICD-10-CM | POA: Diagnosis not present

## 2022-06-04 DIAGNOSIS — E66812 Obesity, class 2: Secondary | ICD-10-CM

## 2022-06-04 DIAGNOSIS — L304 Erythema intertrigo: Secondary | ICD-10-CM | POA: Diagnosis not present

## 2022-06-04 DIAGNOSIS — Z0001 Encounter for general adult medical examination with abnormal findings: Secondary | ICD-10-CM

## 2022-06-04 DIAGNOSIS — Z23 Encounter for immunization: Secondary | ICD-10-CM | POA: Diagnosis not present

## 2022-06-04 DIAGNOSIS — Z6838 Body mass index (BMI) 38.0-38.9, adult: Secondary | ICD-10-CM

## 2022-06-04 DIAGNOSIS — Z0000 Encounter for general adult medical examination without abnormal findings: Secondary | ICD-10-CM | POA: Diagnosis not present

## 2022-06-04 DIAGNOSIS — Z13220 Encounter for screening for lipoid disorders: Secondary | ICD-10-CM

## 2022-06-04 DIAGNOSIS — R351 Nocturia: Secondary | ICD-10-CM | POA: Diagnosis not present

## 2022-06-04 LAB — LIPID PANEL
Cholesterol: 140 mg/dL (ref 0–200)
HDL: 65.1 mg/dL (ref >39.00)
LDL Cholesterol: 64 mg/dL (ref 0–99)
NonHDL: 75.1
Total CHOL/HDL Ratio: 2
Triglycerides: 58 mg/dL (ref 0.0–149.0)
VLDL: 11.6 mg/dL (ref 0.0–40.0)

## 2022-06-04 LAB — COMPREHENSIVE METABOLIC PANEL
ALT: 16 U/L (ref 0–53)
AST: 13 U/L (ref 0–37)
Albumin: 4.1 g/dL (ref 3.5–5.2)
Alkaline Phosphatase: 60 U/L (ref 39–117)
BUN: 10 mg/dL (ref 6–23)
CO2: 28 mEq/L (ref 19–32)
Calcium: 9.4 mg/dL (ref 8.4–10.5)
Chloride: 104 mEq/L (ref 96–112)
Creatinine, Ser: 0.89 mg/dL (ref 0.40–1.50)
GFR: 100.44 mL/min (ref >60.00)
Glucose, Bld: 86 mg/dL (ref 70–99)
Potassium: 4.1 mEq/L (ref 3.5–5.1)
Sodium: 141 mEq/L (ref 135–145)
Total Bilirubin: 0.5 mg/dL (ref 0.2–1.2)
Total Protein: 7.4 g/dL (ref 6.0–8.3)

## 2022-06-04 MED ORDER — KETOCONAZOLE 2 % EX CREA: 1.0000 | TOPICAL_CREAM | Freq: Every day | CUTANEOUS | 1 refills | Status: DC

## 2022-06-04 MED ORDER — NYSTATIN 100000 UNIT/GM EX CREA: 1.0000 | TOPICAL_CREAM | Freq: Two times a day (BID) | CUTANEOUS | 0 refills | Status: DC

## 2022-06-04 MED ORDER — WEGOVY 0.5 MG/0.5ML ~~LOC~~ SOAJ: 0.5000 mg | SUBCUTANEOUS | 0 refills | Status: DC

## 2022-06-04 MED ORDER — MUPIROCIN 2 % EX OINT: 1.0000 | TOPICAL_OINTMENT | Freq: Two times a day (BID) | CUTANEOUS | 0 refills | Status: DC

## 2022-06-04 NOTE — Assessment & Plan Note (Signed)
Repeat PSA 

## 2022-06-04 NOTE — Assessment & Plan Note (Signed)
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 We discussed use of wegovy injection, possible side effects and contraindications. He verbalized understanding and agreed to start injection. BP Readings from Last 3 Encounters:  06/04/22 126/80  03/28/21 118/88  03/24/20 120/82    Sent wegovy rx F/up in 31month

## 2022-06-04 NOTE — Assessment & Plan Note (Addendum)
Recurrent, suprapubic, macular rash with mild erythema minimal improvement with clotrimazole. Switched to nystatin cream and bactroban

## 2022-06-04 NOTE — Patient Instructions (Signed)
Go to lab Start heart healthy diet and daily exercise (133mins weekly)  Calorie Counting for Weight Loss Calories are units of energy. Your body needs a certain number of calories from food to keep going throughout the day. When you eat or drink more calories than your body needs, your body stores the extra calories mostly as fat. When you eat or drink fewer calories than your body needs, your body burns fat to get the energy it needs. Calorie counting means keeping track of how many calories you eat and drink each day. Calorie counting can be helpful if you need to lose weight. If you eat fewer calories than your body needs, you should lose weight. Ask your health care provider what a healthy weight is for you. For calorie counting to work, you will need to eat the right number of calories each day to lose a healthy amount of weight per week. A dietitian can help you figure out how many calories you need in a day and will suggest ways to reach your calorie goal. A healthy amount of weight to lose each week is usually 1-2 lb (0.5-0.9 kg). This usually means that your daily calorie intake should be reduced by 500-750 calories. Eating 1,200-1,500 calories a day can help most women lose weight. Eating 1,500-1,800 calories a day can help most men lose weight. What do I need to know about calorie counting? Work with your health care provider or dietitian to determine how many calories you should get each day. To meet your daily calorie goal, you will need to: Find out how many calories are in each food that you would like to eat. Try to do this before you eat. Decide how much of the food you plan to eat. Keep a food log. Do this by writing down what you ate and how many calories it had. To successfully lose weight, it is important to balance calorie counting with a healthy lifestyle that includes regular activity. Where do I find calorie information?  The number of calories in a food can be found on a  Nutrition Facts label. If a food does not have a Nutrition Facts label, try to look up the calories online or ask your dietitian for help. Remember that calories are listed per serving. If you choose to have more than one serving of a food, you will have to multiply the calories per serving by the number of servings you plan to eat. For example, the label on a package of bread might say that a serving size is 1 slice and that there are 90 calories in a serving. If you eat 1 slice, you will have eaten 90 calories. If you eat 2 slices, you will have eaten 180 calories. How do I keep a food log? After each time that you eat, record the following in your food log as soon as possible: What you ate. Be sure to include toppings, sauces, and other extras on the food. How much you ate. This can be measured in cups, ounces, or number of items. How many calories were in each food and drink. The total number of calories in the food you ate. Keep your food log near you, such as in a pocket-sized notebook or on an app or website on your mobile phone. Some programs will calculate calories for you and show you how many calories you have left to meet your daily goal. What are some portion-control tips? Know how many calories are in a serving. This  will help you know how many servings you can have of a certain food. Use a measuring cup to measure serving sizes. You could also try weighing out portions on a kitchen scale. With time, you will be able to estimate serving sizes for some foods. Take time to put servings of different foods on your favorite plates or in your favorite bowls and cups so you know what a serving looks like. Try not to eat straight from a food's packaging, such as from a bag or box. Eating straight from the package makes it hard to see how much you are eating and can lead to overeating. Put the amount you would like to eat in a cup or on a plate to make sure you are eating the right portion. Use  smaller plates, glasses, and bowls for smaller portions and to prevent overeating. Try not to multitask. For example, avoid watching TV or using your computer while eating. If it is time to eat, sit down at a table and enjoy your food. This will help you recognize when you are full. It will also help you be more mindful of what and how much you are eating. What are tips for following this plan? Reading food labels Check the calorie count compared with the serving size. The serving size may be smaller than what you are used to eating. Check the source of the calories. Try to choose foods that are high in protein, fiber, and vitamins, and low in saturated fat, trans fat, and sodium. Shopping Read nutrition labels while you shop. This will help you make healthy decisions about which foods to buy. Pay attention to nutrition labels for low-fat or fat-free foods. These foods sometimes have the same number of calories or more calories than the full-fat versions. They also often have added sugar, starch, or salt to make up for flavor that was removed with the fat. Make a grocery list of lower-calorie foods and stick to it. Cooking Try to cook your favorite foods in a healthier way. For example, try baking instead of frying. Use low-fat dairy products. Meal planning Use more fruits and vegetables. One-half of your plate should be fruits and vegetables. Include lean proteins, such as chicken, [place], and fish. Lifestyle Each week, aim to do one of the following: 150 minutes of moderate exercise, such as walking. 75 minutes of vigorous exercise, such as running. General information Know how many calories are in the foods you eat most often. This will help you calculate calorie counts faster. Find a way of tracking calories that works for you. Get creative. Try different apps or programs if writing down calories does not work for you. What foods should I eat?  Eat nutritious foods. It is better to have  a nutritious, high-calorie food, such as an avocado, than a food with few nutrients, such as a bag of potato chips. Use your calories on foods and drinks that will fill you up and will not leave you hungry soon after eating. Examples of foods that fill you up are nuts and nut butters, vegetables, lean proteins, and high-fiber foods such as whole grains. High-fiber foods are foods with more than 5 g of fiber per serving. Pay attention to calories in drinks. Low-calorie drinks include water and unsweetened drinks. The items listed above may not be a complete list of foods and beverages you can eat. Contact a dietitian for more information. What foods should I limit? Limit foods or drinks that are not good sources  of vitamins, minerals, or protein or that are high in unhealthy fats. These include: Candy. Other sweets. Sodas, specialty coffee drinks, alcohol, and juice. The items listed above may not be a complete list of foods and beverages you should avoid. Contact a dietitian for more information. How do I count calories when eating out? Pay attention to portions. Often, portions are much larger when eating out. Try these tips to keep portions smaller: Consider sharing a meal instead of getting your own. If you get your own meal, eat only half of it. Before you start eating, ask for a container and put half of your meal into it. When available, consider ordering smaller portions from the menu instead of full portions. Pay attention to your food and drink choices. Knowing the way food is cooked and what is included with the meal can help you eat fewer calories. If calories are listed on the menu, choose the lower-calorie options. Choose dishes that include vegetables, fruits, whole grains, low-fat dairy products, and lean proteins. Choose items that are boiled, broiled, grilled, or steamed. Avoid items that are buttered, battered, fried, or served with cream sauce. Items labeled as crispy are  usually fried, unless stated otherwise. Choose water, low-fat milk, unsweetened iced tea, or other drinks without added sugar. If you want an alcoholic beverage, choose a lower-calorie option, such as a glass of wine or light beer. Ask for dressings, sauces, and syrups on the side. These are usually high in calories, so you should limit the amount you eat. If you want a salad, choose a garden salad and ask for grilled meats. Avoid extra toppings such as bacon, cheese, or fried items. Ask for the dressing on the side, or ask for olive oil and vinegar or lemon to use as dressing. Estimate how many servings of a food you are given. Knowing serving sizes will help you be aware of how much food you are eating at restaurants. Where to find more information Centers for Disease Control and Prevention: http://www.wolf.info/ U.[place]. Department of Agriculture: http://www.wilson-mendoza.org/ Summary Calorie counting means keeping track of how many calories you eat and drink each day. If you eat fewer calories than your body needs, you should lose weight. A healthy amount of weight to lose per week is usually 1-2 lb (0.5-0.9 kg). This usually means reducing your daily calorie intake by 500-750 calories. The number of calories in a food can be found on a Nutrition Facts label. If a food does not have a Nutrition Facts label, try to look up the calories online or ask your dietitian for help. Use smaller plates, glasses, and bowls for smaller portions and to prevent overeating. Use your calories on foods and drinks that will fill you up and not leave you hungry shortly after a meal. This information is not intended to replace advice given to you by your health care provider. Make sure you discuss any questions you have with your health care provider. Document Revised: 04/09/2019 Document Reviewed: 04/09/2019 Elsevier Patient Education  [employer].

## 2022-06-04 NOTE — Progress Notes (Signed)
Complete physical exam  Patient: Brian Ray   DOB: 09-11-1970   52 y.o. Male  MRN: [id number] Visit Date: 06/04/2022  Subjective:    Chief Complaint  Patient presents with   Annual Exam    Fasting- Yes    Brian Ray is a 52 y.o. male who presents today for a complete physical exam. He reports consuming a general diet.  Not consistent  He generally feels well. He reports sleeping well. He does have additional problems to discuss today.  Vision:Yes Dental:Yes STD Screen:Yes PSA:Yes  BP Readings from Last 3 Encounters:  06/04/22 126/80  03/28/21 118/88  03/24/20 120/82   Wt Readings from Last 3 Encounters:  06/04/22 253 lb 3.2 oz (114.9 kg)  03/28/21 250 lb (113.4 kg)  03/24/20 245 lb 3.2 oz (111.2 kg)   Most recent fall risk assessment:    06/04/2022    1:07 PM  [place] in the past year? 0  Number falls in past yr: 0  Injury with Fall? 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 We discussed use of wegovy injection, possible side effects and contraindications. He verbalized understanding and agreed to start injection. BP Readings from Last 3 Encounters:  06/04/22 126/80  03/28/21 118/88  03/24/20 120/82    Sent wegovy rx F/up in 41month  Nocturia Repeat PSA  Intertrigo Recurrent, suprapubic, macular rash with mild erythema minimal improvement with clotrimazole. Switched to nystatin cream and bactroban  Past Medical History:  Diagnosis Date   Chicken pox    Past Surgical  History:  Procedure Laterality Date   INGUINAL HERNIA REPAIR Right    Social History   Socioeconomic History   Marital status: Single    Spouse name: Not on file   Number of children: 1   Years of education: Not on file   Highest education level: Not on file  Occupational History   Not on file  Tobacco Use   Smoking status: Some Days    Types: Cigars    Start date: 2019    Last attempt to quit: 12/12/2020    Years since quitting: 1.4   Smokeless tobacco: Never  Vaping Use   Vaping Use: Never used  Substance and Sexual Activity   Alcohol use: Yes    Comment: social   Drug use: No   Sexual activity: Yes    Birth control/protection: None  Other Topics Concern   Not on file  Social History Narrative   Not on file   Social Determinants of Health   Financial Resource Strain: Not on file  Food Insecurity: Not on file  Transportation Needs: No Transportation Needs (02/27/2022)   PRAPARE - Hydrologist (Medical): No    Lack of Transportation (Non-Medical): No  Physical Activity: Not on file  Stress: Not on file  Social Connections: Not on file  Intimate Partner Violence: Not on file   Family Status  Relation  Name Status   Mother  Deceased   Brother  Alive   Father  (Not Specified)   Sister  Alive   Mat Uncle  Deceased   Neg Hx  (Not Specified)   Family History  Problem Relation Age of Onset   ALS Mother 46   Colon cancer Brother    Alcohol abuse Father    Cancer Maternal Uncle        unknown type.   Esophageal cancer Neg Hx    Rectal cancer Neg Hx    Stomach cancer Neg Hx    No Known Allergies  Patient Care Team: Michelle Wnek, Charlene Brooke, NP as PCP - General (Internal Medicine)   Medications: Outpatient Medications Prior to Visit  Medication Sig   ibuprofen (ADVIL) 200 MG tablet Take 200 mg by mouth every 6 (six) hours as needed.   aspirin EC 81 MG tablet Take 81 mg by mouth daily. Swallow whole.   [DISCONTINUED] clotrimazole  (ANTIFUNGAL, CLOTRIMAZOLE,) 1 % cream Apply 1 application topically 2 (two) times daily. (Patient not taking: Reported on 06/04/2022)   No facility-administered medications prior to visit.    Review of Systems  Constitutional:  Negative for activity change, appetite change and unexpected weight change.  Respiratory: Negative.    Cardiovascular: Negative.   Gastrointestinal: Negative.   Endocrine: Negative for cold intolerance and heat intolerance.  Genitourinary: Negative.   Musculoskeletal: Negative.   Skin: Negative.   Neurological: Negative.   Hematological: Negative.   Psychiatric/Behavioral:  Negative for behavioral problems, decreased concentration, dysphoric mood, hallucinations, self-injury, sleep disturbance and suicidal ideas. The patient is not nervous/anxious.         Objective:  BP 126/80 (BP Location: Left Arm, Patient Position: Sitting, Cuff Size: Normal)   Pulse 75   Temp 98.4 F (36.9 C) (Temporal)   Resp 16   Ht 5\' 8"  (1.727 m)   Wt 253 lb 3.2 oz (114.9 kg)   SpO2 96%   BMI 38.50 kg/m     Physical Exam Vitals and nursing note reviewed.  Constitutional:      General: He is not in acute distress. HENT:     Right Ear: Tympanic membrane, ear canal and external ear normal.     Left Ear: Tympanic membrane, ear canal and external ear normal.     Nose: Nose normal.  Eyes:     Extraocular Movements: Extraocular movements intact.     Conjunctiva/sclera: Conjunctivae normal.     Pupils: Pupils are equal, round, and reactive to light.  Neck:     Thyroid: No thyroid mass, thyromegaly or thyroid tenderness.  Cardiovascular:     Rate and Rhythm: Normal rate and regular rhythm.     Pulses: Normal pulses.     Heart sounds: Normal heart sounds.  Pulmonary:     Effort: Pulmonary effort is normal.     Breath sounds: Normal breath sounds.  Abdominal:     General: Bowel sounds are normal.     Palpations: Abdomen is soft.    Musculoskeletal:        General:  Normal range of motion.     Cervical back: Normal range of motion and neck supple.     Right lower leg: No edema.     Left lower leg: No edema.  Lymphadenopathy:     Cervical: No cervical adenopathy.  Skin:    General: Skin is warm and dry.     Findings: Erythema and rash present. Rash is macular.  Neurological:  Mental Status: He is alert and oriented to person, place, and time.     Cranial Nerves: No cranial nerve deficit.  Psychiatric:        Mood and Affect: Mood normal.        Behavior: Behavior normal.        Thought Content: Thought content normal.     No results found for any visits on 06/04/22.    Assessment & Plan:    Routine Health Maintenance and Physical Exam  Immunization History  Administered Date(s) Administered   Tdap 06/04/2022   Health Maintenance  Topic Date Due   Hepatitis C Screening  Never done   INFLUENZA VACCINE  06/10/2022 (Originally 10/10/2021)   COLONOSCOPY (Pts 45-69yrs Insurance coverage will need to be confirmed)  01/23/2024   DTaP/Tdap/Td (2 - Td or Tdap) 06/03/2032   HIV Screening  Completed   HPV VACCINES  Aged Out   COVID-19 Vaccine  Discontinued   Discussed health benefits of physical activity, and encouraged him to engage in regular exercise appropriate for his age and condition.  Problem List Items Addressed This Visit       Musculoskeletal and Integument   Intertrigo    Recurrent, suprapubic, macular rash with mild erythema minimal improvement with clotrimazole. 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 We discussed use of wegovy injection, possible  side effects and contraindications. He verbalized understanding and agreed to start injection. BP Readings from Last 3 Encounters:  06/04/22 126/80  03/28/21 118/88  03/24/20 120/82    Sent wegovy rx F/up in 69month      Relevant Medications   Semaglutide-Weight Management (WEGOVY) 0.5 MG/0.5ML SOAJ   Nocturia    Repeat PSA      Relevant Orders   PSA   Other Visit Diagnoses     Encounter for preventative adult health care exam with abnormal findings    -  Primary   Relevant Orders   Comprehensive metabolic panel   Lipid panel   Encounter for lipid screening for cardiovascular disease       Relevant Orders   Lipid panel   Encounter for screening for viral disease       Relevant Orders   HIV Antibody (routine testing w rflx)   RPR   Urine cytology ancillary only   Hepatitis C antibody      Return in about 4 weeks (around 07/02/2022) for Weight management.     Wilfred Lacy, NP

## 2022-06-05 LAB — URINE CYTOLOGY ANCILLARY ONLY
Chlamydia: NEGATIVE
Comment: NEGATIVE
Comment: NEGATIVE
Comment: NORMAL
Neisseria Gonorrhea: NEGATIVE
Trichomonas: NEGATIVE

## 2022-06-05 LAB — HIV ANTIBODY (ROUTINE TESTING W REFLEX): HIV 1&2 Ab, 4th Generation: NONREACTIVE

## 2022-06-05 LAB — PSA: PSA: 0.98 ng/mL (ref 0.10–4.00)

## 2022-06-05 LAB — RPR: RPR Ser Ql: NONREACTIVE

## 2022-06-05 LAB — HEPATITIS C ANTIBODY: Hepatitis C Ab: NONREACTIVE

## 2022-06-05 NOTE — Progress Notes (Signed)
Stable Follow instructions as discussed during office visit.

## 2022-06-06 NOTE — Progress Notes (Signed)
Stable Follow instructions as discussed during office visit.

## 2023-07-09 DIAGNOSIS — S50861A Insect bite (nonvenomous) of right forearm, initial encounter: Secondary | ICD-10-CM | POA: Diagnosis not present

## 2023-07-09 DIAGNOSIS — S40861A Insect bite (nonvenomous) of right upper arm, initial encounter: Secondary | ICD-10-CM | POA: Diagnosis not present

## 2023-07-09 DIAGNOSIS — L03113 Cellulitis of right upper limb: Secondary | ICD-10-CM | POA: Diagnosis not present

## 2023-07-09 DIAGNOSIS — L089 Local infection of the skin and subcutaneous tissue, unspecified: Secondary | ICD-10-CM | POA: Diagnosis not present

## 2023-08-02 ENCOUNTER — Ambulatory Visit: Admitting: Nurse Practitioner

## 2023-08-07 ENCOUNTER — Encounter: Payer: Self-pay | Admitting: Nurse Practitioner

## 2023-08-07 ENCOUNTER — Ambulatory Visit (INDEPENDENT_AMBULATORY_CARE_PROVIDER_SITE_OTHER): Admitting: Nurse Practitioner

## 2023-08-07 VITALS — BP 130/82 | HR 80 | Temp 97.8°F | Ht 68.11 in | Wt 252.8 lb

## 2023-08-07 DIAGNOSIS — Z13220 Encounter for screening for lipoid disorders: Secondary | ICD-10-CM | POA: Diagnosis not present

## 2023-08-07 DIAGNOSIS — Z136 Encounter for screening for cardiovascular disorders: Secondary | ICD-10-CM

## 2023-08-07 DIAGNOSIS — Z0001 Encounter for general adult medical examination with abnormal findings: Secondary | ICD-10-CM | POA: Diagnosis not present

## 2023-08-07 DIAGNOSIS — R351 Nocturia: Secondary | ICD-10-CM | POA: Diagnosis not present

## 2023-08-07 DIAGNOSIS — Z800 Family history of malignant neoplasm of digestive organs: Secondary | ICD-10-CM | POA: Diagnosis not present

## 2023-08-07 LAB — COMPREHENSIVE METABOLIC PANEL WITH GFR
ALT: 17 U/L (ref 0–53)
AST: 14 U/L (ref 0–37)
Albumin: 4.3 g/dL (ref 3.5–5.2)
Alkaline Phosphatase: 68 U/L (ref 39–117)
BUN: 13 mg/dL (ref 6–23)
CO2: 28 meq/L (ref 19–32)
Calcium: 9.4 mg/dL (ref 8.4–10.5)
Chloride: 102 meq/L (ref 96–112)
Creatinine, Ser: 0.88 mg/dL (ref 0.40–1.50)
GFR: 99.95 mL/min (ref >60.00)
Glucose, Bld: 83 mg/dL (ref 70–99)
Potassium: 4.4 meq/L (ref 3.5–5.1)
Sodium: 139 meq/L (ref 135–145)
Total Bilirubin: 0.5 mg/dL (ref 0.2–1.2)
Total Protein: 7.6 g/dL (ref 6.0–8.3)

## 2023-08-07 LAB — LIPID PANEL
Cholesterol: 139 mg/dL (ref 0–200)
HDL: 67.3 mg/dL (ref >39.00)
LDL Cholesterol: 58 mg/dL (ref 0–99)
NonHDL: 71.47
Total CHOL/HDL Ratio: 2
Triglycerides: 65 mg/dL (ref 0.0–149.0)
VLDL: 13 mg/dL (ref 0.0–40.0)

## 2023-08-07 LAB — PSA: PSA: 0.96 ng/mL (ref 0.10–4.00)

## 2023-08-07 NOTE — Patient Instructions (Addendum)
 Go to lab Maintain Heart healthy diet and daily exercise. Schedule appointment with GI for repeat colonoscopy: [phone].  Mediterranean Diet A Mediterranean diet is based on the traditions of countries on the [employer]. It focuses on eating more: Fruits and vegetables. Whole grains, beans, nuts, and seeds. Heart-healthy fats. These are fats that are good for your heart. It involves eating less: Dairy. Meat and eggs. Processed foods with added sugar, salt, and fat. This type of diet can help prevent certain conditions. It can also improve outcomes if you have a long-term (chronic) disease, such as kidney or heart disease. What are tips for following this plan? Reading food labels Check packaged foods for: The serving size. For foods such as rice and pasta, the serving size is the amount of cooked product, not dry. The total fat. Avoid foods with saturated fat or trans fat. Added sugars, such as corn syrup. Shopping  Try to have a balanced diet. Buy a variety of foods, such as: Fresh fruits and vegetables. You may be able to get these from local farmers markets. You can also buy them frozen. Grains, beans, nuts, and seeds. Some of these can be bought in bulk. Fresh seafood. Poultry and eggs. Low-fat dairy products. Buy whole ingredients instead of foods that have already been packaged. If you can't get fresh seafood, buy precooked frozen shrimp or canned fish, such as tuna, salmon, or sardines. Stock your pantry so you always have certain foods on hand, such as olive oil, canned tuna, canned tomatoes, rice, pasta, and beans. Cooking Cook foods with extra-virgin olive oil instead of using butter or other vegetable oils. Have meat as a side dish. Have vegetables or grains as your main dish. This means having meat in small portions or adding small amounts of meat to foods like pasta or stew. Use beans or vegetables instead of meat in common dishes like chili or lasagna. Try  out different cooking methods. Try roasting, broiling, steaming, and sauting vegetables. Add frozen vegetables to soups, stews, pasta, or rice. Add nuts or seeds for added healthy fats and plant protein at each meal. You can add these to yogurt, salads, or vegetable dishes. Marinate fish or vegetables using olive oil, lemon juice, garlic, and fresh herbs. Meal planning Plan to eat a vegetarian meal one day each week. Try to work up to two vegetarian meals, if possible. Eat seafood two or more times a week. Have healthy snacks on hand. These may include: Vegetable sticks with hummus. Greek yogurt. Fruit and nut trail mix. Eat balanced meals. These should include: Fruit: 2-3 servings a day. Vegetables: 4-5 servings a day. Low-fat dairy: 2 servings a day. Fish, poultry, or lean meat: 1 serving a day. Beans and legumes: 2 or more servings a week. Nuts and seeds: 1-2 servings a day. Whole grains: 6-8 servings a day. Extra-virgin olive oil: 3-4 servings a day. Limit red meat and sweets to just a few servings a month. Lifestyle  Try to cook and eat meals with your family. Drink enough fluid to keep your pee (urine) pale yellow. Be active every day. This includes: Aerobic exercise, which is exercise that causes your heart to beat faster. Examples include running and swimming. Leisure activities like gardening, walking, or housework. Get 7-8 hours of sleep each night. Drink red wine if your provider says you can. A glass of wine is 5 oz (150 mL). You may be allowed to have: Up to 1 glass a day if you're male  and not pregnant. Up to 2 glasses a day if you're male. What foods should I eat? Fruits Apples. Apricots. Avocado. Berries. Bananas. Cherries. Dates. Figs. Grapes. Lemons. Melon. Oranges. Peaches. Plums. Pomegranate. Vegetables Artichokes. Beets. Broccoli. Cabbage. Carrots. Eggplant. Green beans. Chard. Kale. Spinach. Onions. Leeks. Peas. Squash. Tomatoes. Peppers.  Radishes. Grains Whole-grain pasta. Brown rice. Bulgur wheat. Polenta. Couscous. Whole-wheat bread. Dwyane Glad. Meats and other proteins Beans. Almonds. Sunflower seeds. Pine nuts. Peanuts. Cod. Salmon. Scallops. Shrimp. Tuna. Tilapia. Clams. Oysters. Eggs. Chicken or [place] without skin. Dairy Low-fat milk. Cheese. Greek yogurt. Fats and oils Extra-virgin olive oil. Avocado oil. Grapeseed oil. Beverages Water. Red wine. Herbal tea. Sweets and desserts Greek yogurt with honey. Baked apples. Poached pears. Trail mix. Seasonings and condiments Basil. Cilantro. Coriander. Cumin. Mint. Parsley. Sage. Rosemary. Tarragon. Garlic. Oregano. Thyme. Pepper. Balsamic vinegar. Tahini. Hummus. Tomato sauce. Olives. Mushrooms. The items listed above may not be all the foods and drinks you can have. Talk to a dietitian to learn more. What foods should I limit? This is a list of foods that should be eaten rarely. Fruits Fruit canned in syrup. Vegetables Deep-fried potatoes, like [place] fries. Grains Packaged pasta or rice dishes. Cereal with added sugar. Snacks with added sugar. Meats and other proteins Beef. Pork. Lamb. Chicken or [place] with skin. Hot dogs. Helene Loader. Dairy Ice cream. Sour cream. Whole milk. Fats and oils Butter. Canola oil. Vegetable oil. Beef fat (tallow). Lard. Beverages Juice. Sugar-sweetened soft drinks. Beer. Liquor and spirits. Sweets and desserts Cookies. Cakes. Pies. Candy. Seasonings and condiments Mayonnaise. Pre-made sauces and marinades. The items listed above may not be all the foods and drinks you should limit. Talk to a dietitian to learn more. Where to find more information American Heart Association (AHA): heart.org This information is not intended to replace advice given to you by your health care provider. Make sure you discuss any questions you have with your health care provider. Document Revised: 06/10/2022 Document Reviewed: 06/10/2022 Elsevier  Patient Education  2024 [employer].

## 2023-08-07 NOTE — Progress Notes (Signed)
 Complete physical exam  Patient: Brian Ray   DOB: November 20, 1970   53 y.o. Male  MRN: [id number] Visit Date: 08/07/2023  Subjective:     Chief Complaint  Patient presents with   Annual Exam   Miki Blank is a 53 y.o. male who presents today for a complete physical exam. He reports consuming a general diet. Daily walking He generally feels well. He reports sleeping well. He does not have additional problems to discuss today.  Vision:Within the last year Dental:Yes STD Screen:No PSA:Yes BP Readings from Last 3 Encounters:  08/07/23 130/82  06/04/22 126/80  03/28/21 118/88   Wt Readings from Last 3 Encounters:  08/07/23 252 lb 12.8 oz (114.7 kg)  06/04/22 253 lb 3.2 oz (114.9 kg)  03/28/21 250 lb (113.4 kg)    Most recent fall risk assessment:    08/07/2023    1:10 PM  Fall Risk   Falls in the past year? 0  Injury with Fall? 0  Risk for fall due to : No Fall Risks  Follow up Falls evaluation completed     Depression screen:Yes - No Depression Most recent depression screenings:    08/07/2023    1:10 PM 06/04/2022    1:07 PM  PHQ 2/9 Scores  PHQ - 2 Score 0 0  PHQ- 9 Score 0     HPI  Family history of malignant neoplasm of colon in relative diagnosed when younger than 51 years of age Advised to schedule appointment for repeat colonoscopy   Past Medical History:  Diagnosis Date   Chicken pox    Past Surgical History:  Procedure Laterality Date   INGUINAL HERNIA REPAIR Right    Social History   Socioeconomic History   Marital status: Single    Spouse name: Not on file   Number of children: 1   Years of education: Not on file   Highest education level: Not on file  Occupational History   Not on file  Tobacco Use   Smoking status: Some Days    Types: Cigars    Start date: 2019   Smokeless tobacco: Never  Vaping Use   Vaping status: Never Used  Substance and Sexual Activity   Alcohol use: Yes    Comment: social   Drug use: No   Sexual  activity: Yes    Birth control/protection: None  Other Topics Concern   Not on file  Social History Narrative   Not on file   Social Drivers of Health   Financial Resource Strain: Not on file  Food Insecurity: Not on file  Transportation Needs: No Transportation Needs (02/27/2022)   PRAPARE - Administrator, Civil Service (Medical): No    Lack of Transportation (Non-Medical): No  Physical Activity: Not on file  Stress: Not on file  Social Connections: Unknown (07/25/2021)   Received from [facility]   Social Network    Social Network: Not on file  Intimate Partner Violence: Unknown (06/16/2021)   Received from [facility], [facility]   HITS    Physically Hurt: Not on file    Insult or Talk Down To: Not on file    Threaten Physical Harm: Not on file    Scream or Curse: Not on file   Family Status  Relation Name Status   Mother  Deceased   Father  Deceased   Sister  Alive   Brother  Alive   Mat Uncle  Deceased   Neg Hx  (Not  Specified)  No partnership data on file   Family History  Problem Relation Age of Onset   ALS Mother 63   Alcohol abuse Father    Colon cancer Brother    Cancer Maternal Uncle        unknown type.   Esophageal cancer Neg Hx    Rectal cancer Neg Hx    Stomach cancer Neg Hx    No Known Allergies  Patient Care Team: Emoni Whitworth, Connye Delaine, NP as PCP - General (Internal Medicine)   Medications: Outpatient Medications Prior to Visit  Medication Sig   [DISCONTINUED] aspirin EC 81 MG tablet Take 81 mg by mouth daily. Swallow whole. (Patient not taking: Reported on 08/07/2023)   [DISCONTINUED] ibuprofen (ADVIL) 200 MG tablet Take 200 mg by mouth every 6 (six) hours as needed. (Patient not taking: Reported on 08/07/2023)   [DISCONTINUED] mupirocin  ointment (BACTROBAN ) 2 % Apply 1 Application topically 2 (two) times daily. x1week (Patient not taking: Reported on 08/07/2023)   [DISCONTINUED] nystatin  cream (MYCOSTATIN ) Apply  1 Application topically 2 (two) times daily. (Patient not taking: Reported on 08/07/2023)   [DISCONTINUED] Semaglutide -Weight Management (WEGOVY ) 0.5 MG/0.5ML SOAJ Inject 0.5 mg into the skin once a week. (Patient not taking: Reported on 08/07/2023)   No facility-administered medications prior to visit.    Review of Systems      Objective:  BP 130/82 (BP Location: Left Arm, Patient Position: Sitting)   Pulse 80   Temp 97.8 F (36.6 C) (Temporal)   Ht 5' 8.11" (1.73 m)   Wt 252 lb 12.8 oz (114.7 kg)   SpO2 98%   BMI 38.31 kg/m     Physical Exam Vitals and nursing note reviewed.  Constitutional:      General: He is not in acute distress. HENT:     Right Ear: Tympanic membrane, ear canal and external ear normal.     Left Ear: Tympanic membrane, ear canal and external ear normal.     Nose: Nose normal.  Eyes:     Extraocular Movements: Extraocular movements intact.     Conjunctiva/sclera: Conjunctivae normal.     Pupils: Pupils are equal, round, and reactive to light.  Neck:     Thyroid : No thyroid  mass, thyromegaly or thyroid  tenderness.  Cardiovascular:     Rate and Rhythm: Normal rate and regular rhythm.     Pulses: Normal pulses.     Heart sounds: Normal heart sounds.  Pulmonary:     Effort: Pulmonary effort is normal.     Breath sounds: Normal breath sounds.  Abdominal:     General: Bowel sounds are normal.     Palpations: Abdomen is soft.  Musculoskeletal:        General: Normal range of motion.     Cervical back: Normal range of motion and neck supple.     Right lower leg: No edema.     Left lower leg: No edema.  Lymphadenopathy:     Cervical: No cervical adenopathy.  Skin:    General: Skin is warm and dry.  Neurological:     Mental Status: He is alert and oriented to person, place, and time.     Cranial Nerves: No cranial nerve deficit.  Psychiatric:        Mood and Affect: Mood normal.        Behavior: Behavior normal.        Thought Content: Thought  content normal.     No results found for any visits on 08/07/23.  Assessment & Plan:    Routine Health Maintenance and Physical Exam  Immunization History  Administered Date(s) Administered   Tdap 06/04/2022   Health Maintenance  Topic Date Due   Zoster Vaccines- Shingrix (1 of 2) 11/07/2023 (Originally 08/24/2022)   Pneumococcal Vaccine 67-91 Years old (1 of 2 - PCV) 08/06/2024 (Originally 08/24/1991)   INFLUENZA VACCINE  10/11/2023   Colonoscopy  01/23/2024   DTaP/Tdap/Td (2 - Td or Tdap) 06/03/2032   Hepatitis C Screening  Completed   HIV Screening  Completed   HPV VACCINES  Aged Out   Meningococcal B Vaccine  Aged Out   COVID-19 Vaccine  Discontinued   Discussed health benefits of physical activity, and encouraged him to engage in regular exercise appropriate for his age and condition.  Problem List Items Addressed This Visit     Family history of malignant neoplasm of colon in relative diagnosed when younger than 51 years of age   Advised to schedule appointment for repeat colonoscopy      Nocturia   Relevant Orders   PSA   Other Visit Diagnoses       Encounter for preventative adult health care exam with abnormal findings    -  Primary   Relevant Orders   Comprehensive metabolic panel with GFR     Encounter for lipid screening for cardiovascular disease       Relevant Orders   Lipid panel      Return in about 1 year (around 08/06/2024) for CPE (fasting).     Kathrene Parents, NP

## 2023-08-07 NOTE — Assessment & Plan Note (Signed)
 Advised to schedule appointment for repeat colonoscopy

## 2023-08-08 ENCOUNTER — Ambulatory Visit: Payer: Self-pay | Admitting: Nurse Practitioner

## 2023-08-16 ENCOUNTER — Ambulatory Visit: Admitting: Nurse Practitioner

## 2023-08-26 ENCOUNTER — Encounter: Admitting: Nurse Practitioner

## 2023-09-26 ENCOUNTER — Encounter: Payer: Self-pay | Admitting: Nurse Practitioner

## 2023-09-26 ENCOUNTER — Ambulatory Visit: Admitting: Nurse Practitioner

## 2023-09-26 VITALS — BP 132/80 | HR 66 | Temp 97.9°F | Ht 68.0 in | Wt 252.4 lb

## 2023-09-26 DIAGNOSIS — M722 Plantar fascial fibromatosis: Secondary | ICD-10-CM | POA: Diagnosis not present

## 2023-09-26 MED ORDER — NAPROXEN 500 MG PO TABS: 500.0000 mg | ORAL_TABLET | Freq: Two times a day (BID) | ORAL | 1 refills | Status: AC

## 2023-09-26 NOTE — Progress Notes (Signed)
   Acute Office Visit  Subjective:    Patient ID: Brian Ray, male    DOB: 1970-10-08, 53 y.o.   MRN: [id number]  Chief Complaint  Patient presents with   Foot Pain    Right foot pain arch of foot to heel for 1 month pain worse when siting periods of time walking helps     Foot Pain This is a new problem. The current episode started 1 to 4 weeks ago. The problem occurs constantly. The problem has been unchanged. Pertinent negatives include no joint swelling, numbness or weakness. The symptoms are aggravated by walking. He has tried nothing for the symptoms.  Onset with switch in footwear-crocs without arch support.  No outpatient medications prior to visit.   No facility-administered medications prior to visit.   Reviewed past medical and social history.  Review of Systems  Musculoskeletal:  Negative for joint swelling.  Neurological:  Negative for weakness and numbness.   Per HPI     Objective:    Physical Exam Cardiovascular:     Pulses:          Dorsalis pedis pulses are 2+ on the right side.       Posterior tibial pulses are 2+ on the right side and 2+ on the left side.  Musculoskeletal:     Right foot: Normal range of motion. Tenderness present. No swelling or laceration.       Feet:  Feet:     Right foot:     Skin integrity: Skin integrity normal.     Toenail Condition: Right toenails are normal.     Left foot:     Skin integrity: Skin integrity normal.     Toenail Condition: Left toenails are normal.     BP 132/80 (BP Location: Left Arm, Patient Position: Sitting, Cuff Size: Large)   Pulse 66   Temp 97.9 F (36.6 C) (Oral)   Ht 5' 8 (1.727 m)   Wt 252 lb 6.4 oz (114.5 kg)   SpO2 96%   BMI 38.38 kg/m    No results found for any visits on 09/26/23.      Assessment & Plan:   Problem List Items Addressed This Visit   None Visit Diagnoses       Plantar fasciitis of right foot    -  Primary   Relevant Medications   naproxen  (NAPROSYN ) 500 MG  tablet      Meds ordered this encounter  Medications   naproxen  (NAPROSYN ) 500 MG tablet    Sig: Take 1 tablet (500 mg total) by mouth 2 (two) times daily with a meal.    Dispense:  30 tablet    Refill:  1    Supervising Provider:   BERNETA FALLOW ALFRED [[id number]]  Take naproxen  1tab every 12hrs x3days, then 1tab daily x7days, then as needed with food Cold compress massage every morning Use shoes with arch support.  Return if symptoms worsen or fail to improve.    Roselie Mood, NP

## 2023-09-26 NOTE — Patient Instructions (Signed)
 Take naproxen  1tab every 12hrs x3days, then 1tab daily x7days, then as needed with food Cold compress massage every morning Use shoes with arch support.  Exercises for Plantar Fasciitis Foot and leg exercises can help if you have plantar fasciitis. Only do the exercises you were told to do. Make sure you know how to do the exercises safely. Follow the steps below. It's normal to feel mild discomfort. Stop if you feel pain or your pain gets worse. Do not start these exercises until told by your health care provider. Stretching and range-of-motion exercises These exercises warm up your muscles and joints. They also help with movement and flexibility of your foot. They can help with pain. Plantar fascia stretch This exercise will stretch your plantar fascia, which is a band of thick tissue on the bottom of your foot. Sit with your left / right leg crossed over your other knee. Hold your heel with one hand with that thumb near your arch. With your other hand, hold your toes. Gently pull your toes back toward the top of your foot. You should feel a stretch on the bottom of your toes, on the bottom of your foot, or both. Hold this stretch for __________ seconds. Slowly let go of your toes. Go back to the starting position. Repeat __________ times. Do this exercise __________ times a day. Gastroc stretch, standing This exercise is called an upper calf, or gastroc, stretch. It stretches the muscles in the back of your upper calf. Stand with your hands against a wall. Extend your left / right leg behind you. Bend your front knee just a little. Keep your heels on the floor, your toes facing forward, and your back knee straight. Shift your weight toward the wall. Do not arch your back. You should feel a gentle stretch in your upper calf. Hold this position for __________ seconds. Repeat __________ times. Do this exercise __________ times a day. Soleus stretch, standing This exercise is called a lower  calf, or soleus, stretch. It stretches the muscles in the back of your lower calf. Stand with your hands against a wall. Extend your left / right leg behind you, and bend your front knee slightly. Keep your heels on the floor and your toes facing forward. Bend your back knee and shift your weight slightly over your back leg. You should feel a gentle stretch deep in your lower calf. Hold this position for __________ seconds. Repeat __________ times. Do this exercise __________ times a day. Gastroc and soleus stretch, standing step This exercise stretches the muscles in the back of your lower leg. This includes your gastroc and soleus muscles. Stand with the ball of your left / right foot on the front of a step. The ball of your foot is on the walking surface, right under your toes. Keep your other foot firmly on the same step. Hold on to the wall or a railing for balance. Slowly lift your other foot, letting your body weight press your heel down over the edge of the front of the step. Keep your knee straight and unbent. You should feel a stretch in your calf. Hold this position for __________ seconds. Return both feet to the step. Repeat this exercise with a slight bend in your left / right knee. Repeat __________ times with your left / right knee straight and __________ times with your left / right knee bent. Do this exercise __________ times a day. Balance exercise This exercise builds your balance and strength control of your  arch. It helps take pressure off your plantar fascia. Single leg stand If this exercise is too easy, you can try it with your eyes closed or while standing on a pillow. Without shoes, stand near a railing or in a doorway. You may hold on to the railing or doorway as needed. Stand on your left / right foot. Keep your big toe down on the floor. Lift the arch of your foot. You should feel a stretch across the bottom of your foot and arch. Do not let your foot roll  inward. Hold this position for __________ seconds. Repeat __________ times. Do this exercise __________ times a day. This information is not intended to replace advice given to you by your health care provider. Make sure you discuss any questions you have with your health care provider. Document Revised: 07/30/2022 Document Reviewed: 07/30/2022 Elsevier Patient Education  2024 [employer].

## 2023-10-01 ENCOUNTER — Ambulatory Visit: Admitting: Nurse Practitioner

## 2023-10-04 ENCOUNTER — Encounter: Admitting: Nurse Practitioner

## 2023-11-13 ENCOUNTER — Telehealth: Payer: Self-pay | Admitting: Nurse Practitioner

## 2023-11-13 NOTE — Telephone Encounter (Signed)
 Patient dropped off document [employer] , to be filled out by provider. Patient requested to send it back via Call Patient to pick up within ASAP. Document is located in providers tray at front office.Please advise at [facility] [phone]

## 2023-11-14 NOTE — Telephone Encounter (Signed)
 Called and spoke with patient this morning to ask what the form is for due to it asking about a medical condition. Per patient he said that he received the form in the mail after applying for unemployment. He stated that when he was working his previous job he quit due to not being able to perform a duty. He stated the he believes that the job reported it as a medical condition when contacted by the unemployment office. I explained to him that Roselie was wanting to see him and he informed me that he saw her for his CPE back in May and an appointment in July for his foot and that he feels like there is not a need for an appointment. I explained that it was for the form and to get a better understanding for the medical condition that was reported since there is not any previous FMLA or Disability forms in his chart from [employer]. Patient stated to just shred the forms because he did not tell the job he quit for a medical reason or condition. I asked if he was sure that he wanted me to shred the form or I can just mail it back to him. He stated to just shred it it does him no good to have the form. I thanked him for taking my call.

## 2023-11-14 NOTE — Telephone Encounter (Signed)
CLINICAL USE BELOW THIS LINE (use X to signify taken)  __X__Form received and placed in providers office for signature. ____Form completed and faxed to LOA Dept. ____Form completed & LVM to notify pt ready for pick up ____Charge sheet & copy of form in front office folder for office supervisor.
